# Patient Record
Sex: Female | Born: 1970 | Race: Black or African American | Hispanic: No | Marital: Single | Smoking: Never smoker
Health system: Southern US, Community
[De-identification: ages and names within clinical notes are randomized; demographics above are authoritative.]

## PROBLEM LIST (undated history)

## (undated) DIAGNOSIS — T7840XA Allergy, unspecified, initial encounter: Secondary | ICD-10-CM

## (undated) DIAGNOSIS — G245 Blepharospasm: Secondary | ICD-10-CM

## (undated) DIAGNOSIS — D649 Anemia, unspecified: Secondary | ICD-10-CM

## (undated) HISTORY — DX: Allergy, unspecified, initial encounter: T78.40XA

## (undated) HISTORY — DX: Anemia, unspecified: D64.9

## (undated) HISTORY — DX: Blepharospasm: G24.5

---

## 1995-08-31 HISTORY — PX: WISDOM TOOTH EXTRACTION: SHX21

## 1997-11-12 ENCOUNTER — Other Ambulatory Visit: Admission: RE | Admit: 1997-11-12 | Discharge: 1997-11-12 | Payer: Self-pay | Admitting: Obstetrics & Gynecology

## 1998-04-22 ENCOUNTER — Other Ambulatory Visit: Admission: RE | Admit: 1998-04-22 | Discharge: 1998-04-22 | Payer: Self-pay | Admitting: Obstetrics & Gynecology

## 1998-09-12 ENCOUNTER — Other Ambulatory Visit: Admission: RE | Admit: 1998-09-12 | Discharge: 1998-09-12 | Payer: Self-pay | Admitting: Obstetrics & Gynecology

## 1999-07-02 ENCOUNTER — Encounter: Admission: RE | Admit: 1999-07-02 | Discharge: 1999-07-02 | Payer: Self-pay | Admitting: Family Medicine

## 1999-07-16 ENCOUNTER — Encounter: Admission: RE | Admit: 1999-07-16 | Discharge: 1999-07-16 | Payer: Self-pay | Admitting: Family Medicine

## 1999-09-18 ENCOUNTER — Other Ambulatory Visit: Admission: RE | Admit: 1999-09-18 | Discharge: 1999-09-18 | Payer: Self-pay | Admitting: Obstetrics & Gynecology

## 2000-03-01 ENCOUNTER — Emergency Department (HOSPITAL_COMMUNITY): Admission: EM | Admit: 2000-03-01 | Discharge: 2000-03-01 | Payer: Self-pay | Admitting: Emergency Medicine

## 2000-03-01 ENCOUNTER — Encounter: Payer: Self-pay | Admitting: Emergency Medicine

## 2000-03-17 ENCOUNTER — Encounter: Admission: RE | Admit: 2000-03-17 | Discharge: 2000-03-17 | Payer: Self-pay | Admitting: Family Medicine

## 2000-09-20 ENCOUNTER — Other Ambulatory Visit: Admission: RE | Admit: 2000-09-20 | Discharge: 2000-09-20 | Payer: Self-pay | Admitting: Obstetrics & Gynecology

## 2001-06-06 ENCOUNTER — Encounter: Payer: Self-pay | Admitting: Obstetrics and Gynecology

## 2001-06-06 ENCOUNTER — Inpatient Hospital Stay (HOSPITAL_COMMUNITY): Admission: AD | Admit: 2001-06-06 | Discharge: 2001-06-08 | Payer: Self-pay | Admitting: Obstetrics & Gynecology

## 2001-11-03 ENCOUNTER — Other Ambulatory Visit: Admission: RE | Admit: 2001-11-03 | Discharge: 2001-11-03 | Payer: Self-pay | Admitting: Obstetrics & Gynecology

## 2002-06-12 ENCOUNTER — Encounter: Payer: Self-pay | Admitting: Obstetrics & Gynecology

## 2002-06-12 ENCOUNTER — Encounter: Admission: RE | Admit: 2002-06-12 | Discharge: 2002-06-12 | Payer: Self-pay | Admitting: Obstetrics & Gynecology

## 2002-08-27 ENCOUNTER — Encounter: Admission: RE | Admit: 2002-08-27 | Discharge: 2002-08-27 | Payer: Self-pay | Admitting: Family Medicine

## 2002-12-14 ENCOUNTER — Other Ambulatory Visit: Admission: RE | Admit: 2002-12-14 | Discharge: 2002-12-14 | Payer: Self-pay | Admitting: Obstetrics & Gynecology

## 2003-12-17 ENCOUNTER — Other Ambulatory Visit: Admission: RE | Admit: 2003-12-17 | Discharge: 2003-12-17 | Payer: Self-pay | Admitting: Obstetrics & Gynecology

## 2004-06-03 ENCOUNTER — Encounter: Admission: RE | Admit: 2004-06-03 | Discharge: 2004-06-03 | Payer: Self-pay | Admitting: Family Medicine

## 2004-12-18 ENCOUNTER — Other Ambulatory Visit: Admission: RE | Admit: 2004-12-18 | Discharge: 2004-12-18 | Payer: Self-pay | Admitting: Obstetrics & Gynecology

## 2008-08-20 ENCOUNTER — Encounter (INDEPENDENT_AMBULATORY_CARE_PROVIDER_SITE_OTHER): Payer: Self-pay | Admitting: *Deleted

## 2008-09-03 ENCOUNTER — Ambulatory Visit: Payer: Self-pay | Admitting: Family Medicine

## 2008-09-03 ENCOUNTER — Telehealth (INDEPENDENT_AMBULATORY_CARE_PROVIDER_SITE_OTHER): Payer: Self-pay | Admitting: *Deleted

## 2008-09-03 DIAGNOSIS — R55 Syncope and collapse: Secondary | ICD-10-CM

## 2008-09-04 ENCOUNTER — Telehealth (INDEPENDENT_AMBULATORY_CARE_PROVIDER_SITE_OTHER): Payer: Self-pay | Admitting: *Deleted

## 2008-09-05 ENCOUNTER — Telehealth (INDEPENDENT_AMBULATORY_CARE_PROVIDER_SITE_OTHER): Payer: Self-pay | Admitting: *Deleted

## 2008-09-05 LAB — CONVERTED CEMR LAB
ALT: 14 units/L (ref 0–35)
AST: 23 units/L (ref 0–37)
Ammonia: 89 umol/L — ABNORMAL HIGH (ref 11–35)
Basophils Relative: 0 % (ref 0.0–3.0)
Bilirubin, Direct: 0.1 mg/dL (ref 0.0–0.3)
CO2: 26 meq/L (ref 19–32)
Chloride: 107 meq/L (ref 96–112)
Cholesterol: 174 mg/dL (ref 0–200)
Ferritin: 2.4 ng/mL — ABNORMAL LOW (ref 10.0–291.0)
GFR calc non Af Amer: 99 mL/min
HCT: 23.6 % — CL (ref 36.0–46.0)
Hemoglobin: 7.1 g/dL — CL (ref 12.0–15.0)
MCHC: 30.6 g/dL (ref 30.0–36.0)
MCV: 59.3 fL — ABNORMAL LOW (ref 78.0–100.0)
Potassium: 3.9 meq/L (ref 3.5–5.1)
RBC: 4.02 M/uL (ref 3.87–5.11)
Sodium: 141 meq/L (ref 135–145)
TSH: 0.83 microintl units/mL (ref 0.35–5.50)
Total Bilirubin: 0.5 mg/dL (ref 0.3–1.2)
Transferrin: 363.9 mg/dL — ABNORMAL HIGH (ref >=212.0)
VLDL: 15 mg/dL (ref 0–40)

## 2008-09-06 ENCOUNTER — Encounter (INDEPENDENT_AMBULATORY_CARE_PROVIDER_SITE_OTHER): Payer: Self-pay | Admitting: *Deleted

## 2008-09-12 ENCOUNTER — Ambulatory Visit: Payer: Self-pay | Admitting: Family Medicine

## 2008-09-17 ENCOUNTER — Encounter (INDEPENDENT_AMBULATORY_CARE_PROVIDER_SITE_OTHER): Payer: Self-pay | Admitting: *Deleted

## 2008-10-02 ENCOUNTER — Ambulatory Visit: Payer: Self-pay | Admitting: Family Medicine

## 2008-10-03 ENCOUNTER — Encounter (INDEPENDENT_AMBULATORY_CARE_PROVIDER_SITE_OTHER): Payer: Self-pay | Admitting: *Deleted

## 2008-10-03 LAB — CONVERTED CEMR LAB
Basophils Absolute: 0 10*3/uL (ref 0.0–0.1)
Basophils Relative: 0.1 % (ref 0.0–3.0)
Eosinophils Absolute: 0.3 10*3/uL (ref 0.0–0.7)
Ferritin: 11.7 ng/mL (ref 10.0–291.0)
Lymphocytes Relative: 25.2 % (ref 12.0–46.0)
MCHC: 31.4 g/dL (ref 30.0–36.0)
Neutrophils Relative %: 63.4 % (ref 43.0–77.0)
Platelets: 346 10*3/uL (ref 150–400)
RBC: 4.48 M/uL (ref 3.87–5.11)
Saturation Ratios: 61.5 % — ABNORMAL HIGH (ref 20.0–50.0)
Transferrin: 298.4 mg/dL (ref >=212.0)
WBC: 6.1 10*3/uL (ref 4.5–10.5)

## 2008-10-16 ENCOUNTER — Encounter: Admission: RE | Admit: 2008-10-16 | Discharge: 2008-10-16 | Payer: Self-pay | Admitting: Obstetrics & Gynecology

## 2008-10-23 ENCOUNTER — Encounter: Admission: RE | Admit: 2008-10-23 | Discharge: 2008-10-23 | Payer: Self-pay | Admitting: Obstetrics & Gynecology

## 2010-06-04 ENCOUNTER — Encounter: Admission: RE | Admit: 2010-06-04 | Discharge: 2010-06-04 | Payer: Self-pay | Admitting: Obstetrics & Gynecology

## 2010-06-12 ENCOUNTER — Encounter: Admission: RE | Admit: 2010-06-12 | Discharge: 2010-06-12 | Payer: Self-pay | Admitting: Obstetrics & Gynecology

## 2010-09-19 ENCOUNTER — Encounter: Payer: Self-pay | Admitting: Obstetrics & Gynecology

## 2011-04-28 ENCOUNTER — Encounter: Payer: Self-pay | Admitting: Family Medicine

## 2011-05-31 ENCOUNTER — Telehealth: Payer: Self-pay

## 2011-05-31 ENCOUNTER — Encounter: Payer: Self-pay | Admitting: Family Medicine

## 2011-05-31 ENCOUNTER — Ambulatory Visit (INDEPENDENT_AMBULATORY_CARE_PROVIDER_SITE_OTHER): Payer: BC Managed Care – PPO | Admitting: Family Medicine

## 2011-05-31 DIAGNOSIS — D649 Anemia, unspecified: Secondary | ICD-10-CM

## 2011-05-31 DIAGNOSIS — Z0000 Encounter for general adult medical examination without abnormal findings: Secondary | ICD-10-CM

## 2011-05-31 DIAGNOSIS — Z13220 Encounter for screening for lipoid disorders: Secondary | ICD-10-CM

## 2011-05-31 DIAGNOSIS — R42 Dizziness and giddiness: Secondary | ICD-10-CM

## 2011-05-31 LAB — LIPID PANEL
HDL: 60.3 mg/dL (ref >39.00)
LDL Cholesterol: 72 mg/dL (ref 0–99)
Total CHOL/HDL Ratio: 2
VLDL: 11 mg/dL (ref 0.0–40.0)

## 2011-05-31 LAB — HEPATIC FUNCTION PANEL
ALT: 9 U/L (ref 0–35)
AST: 19 U/L (ref 0–37)
Alkaline Phosphatase: 57 U/L (ref 39–117)
Bilirubin, Direct: 0 mg/dL (ref 0.0–0.3)
Total Bilirubin: 0.4 mg/dL (ref 0.3–1.2)

## 2011-05-31 LAB — BASIC METABOLIC PANEL
BUN: 10 mg/dL (ref 6–23)
Chloride: 110 mEq/L (ref 96–112)
GFR: 114.13 mL/min (ref >60.00)
Potassium: 4 mEq/L (ref 3.5–5.1)
Sodium: 143 mEq/L (ref 135–145)

## 2011-05-31 LAB — TSH: TSH: 1.3 u[IU]/mL (ref 0.35–5.50)

## 2011-05-31 LAB — CBC WITH DIFFERENTIAL/PLATELET
Hemoglobin: 6.1 g/dL — CL (ref 12.0–15.0)
MCHC: 27.8 g/dL — ABNORMAL LOW (ref 30.0–36.0)
Platelets: 453 10*3/uL — ABNORMAL HIGH (ref 150.0–400.0)
RDW: 22.9 % — ABNORMAL HIGH (ref 11.5–14.6)

## 2011-05-31 MED ORDER — FERROUS SULFATE 325 (65 FE) MG PO TABS: 325.0000 mg | ORAL_TABLET | Freq: Three times a day (TID) | ORAL | Status: DC

## 2011-05-31 NOTE — Assessment & Plan Note (Signed)
Most likely due to recurrent anemia.  Check labs to r/o thyroid or electrolyte abnormality.  Reviewed supportive care and red flags that should prompt return.  Pt expressed understanding and is in agreement w/ plan.

## 2011-05-31 NOTE — Progress Notes (Signed)
  Subjective:    Patient ID: Kerry Lawson, female    DOB: [birth year], 40 y.o.   MRN: [id number]  HPI CPE- Frankey Poot.  UTD on mammo  Dizziness- will occur after 10 minutes of exercise or by 2nd flight of stairs.  Some associated SOB.  Hx of anemia in the past, admits to poor fluid intake.  Attempting to eat more regularly.   Review of Systems ROS Patient reports no vision/ hearing changes, adenopathy,fever, weight change,  persistant/recurrent hoarseness , swallowing issues, chest pain, palpitations, edema, persistant/recurrent cough, hemoptysis, gastrointestinal bleeding (melena, rectal bleeding), abdominal pain, significant heartburn, bowel changes, GU symptoms (dysuria, hematuria, incontinence), Gyn symptoms (abnormal  bleeding, pain),  syncope, focal weakness, memory loss, numbness & tingling, skin/hair/nail changes, abnormal bruising or bleeding, anxiety, or depression.     Objective:   Physical Exam  General Appearance:    Alert, cooperative, no distress, appears stated age  Head:    Normocephalic, without obvious abnormality, atraumatic  Eyes:    PERRL, conjunctiva/corneas clear, EOM's intact, fundi    benign, both eyes.  + conjunctival pallor  Ears:    Normal TM's and external ear canals, both ears  Nose:   Nares normal, septum midline, mucosa normal, no drainage    or sinus tenderness  Throat:   Lips, mucosa, and tongue normal; teeth and gums normal, pallor of oral mucosa/tongue  Neck:   Supple, symmetrical, trachea midline, no adenopathy;    Thyroid: no enlargement/tenderness/nodules  Back:     Symmetric, no curvature, ROM normal, no CVA tenderness  Lungs:     Clear to auscultation bilaterally, respirations unlabored  Chest Wall:    No tenderness or deformity   Heart:    Regular rate and rhythm, S1 and S2 normal, no murmur, rub   or gallop  Breast Exam:    Deferred to GYN  Abdomen:     Soft, non-tender, bowel sounds active all four quadrants,    no masses, no  organomegaly  Genitalia:    Deferred to GYN  Rectal:    Extremities:   Extremities normal, atraumatic, no cyanosis or edema  Pulses:   2+ and symmetric all extremities  Skin:   Skin color, texture, turgor normal, no rashes or lesions  Lymph nodes:   Cervical, supraclavicular, and axillary nodes normal  Neurologic:   CNII-XII intact, normal strength, sensation and reflexes    throughout          Assessment & Plan:

## 2011-05-31 NOTE — Assessment & Plan Note (Signed)
Pt has hx of iron deficiency anemia requiring TID iron.  Pt had stopped iron ~9 months ago.  Started having sxs 6 months ago.  Will check labs and determine appropriate dosing of iron based on these.  Pt expressed understanding and is in agreement w/ plan.

## 2011-05-31 NOTE — Patient Instructions (Signed)
We'll notify you of your lab results and restart iron as needed My guess is that the dizziness and shortness of breath are all due to anemia You look great!  Keep up the good work! Call with any questions or concerns CONGRATS on the upcoming graduation!!!

## 2011-05-31 NOTE — Telephone Encounter (Signed)
Pt aware and Rx sent to pharmacy  

## 2011-05-31 NOTE — Assessment & Plan Note (Signed)
Pt's PE WNL w/ exception of pallor.  UTD on gyn screening.  Check labs.  Anticipatory guidance provided.

## 2011-05-31 NOTE — Telephone Encounter (Signed)
Message copied by Beverely Low on Mon May 31, 2011  4:58 PM ------      Message from: Sheliah Hatch      Created: Mon May 31, 2011  4:41 PM       Pt's Hgb is critically low again.  Needs to resume FeSO4 325mg  TID to improve blood counts.            Remainder of labs look good!

## 2011-05-31 NOTE — Telephone Encounter (Signed)
Message copied by Beverely Low on Mon May 31, 2011  4:49 PM ------      Message from: Sheliah Hatch      Created: Mon May 31, 2011  4:41 PM       Pt's Hgb is critically low again.  Needs to resume FeSO4 325mg  TID to improve blood counts.            Remainder of labs look good!

## 2011-05-31 NOTE — Telephone Encounter (Signed)
Left message for pt to call back  °

## 2011-06-02 LAB — VITAMIN D 1,25 DIHYDROXY
Vitamin D 1, 25 (OH)2 Total: 73 pg/mL — ABNORMAL HIGH (ref 18–72)
Vitamin D3 1, 25 (OH)2: 73 pg/mL

## 2011-06-04 NOTE — Progress Notes (Signed)
Quick Note:  Labs mailed and pt aware ______

## 2011-06-09 ENCOUNTER — Other Ambulatory Visit: Payer: Self-pay | Admitting: Obstetrics & Gynecology

## 2011-06-09 DIAGNOSIS — Z1231 Encounter for screening mammogram for malignant neoplasm of breast: Secondary | ICD-10-CM

## 2011-06-15 ENCOUNTER — Ambulatory Visit
Admission: RE | Admit: 2011-06-15 | Discharge: 2011-06-15 | Disposition: A | Discharge status: Home / Self Care | Payer: BC Managed Care – PPO | Source: Ambulatory Visit | Attending: Obstetrics & Gynecology | Admitting: Obstetrics & Gynecology

## 2011-06-15 DIAGNOSIS — Z1231 Encounter for screening mammogram for malignant neoplasm of breast: Secondary | ICD-10-CM

## 2012-11-08 ENCOUNTER — Other Ambulatory Visit: Payer: Self-pay

## 2012-11-08 DIAGNOSIS — Z1231 Encounter for screening mammogram for malignant neoplasm of breast: Secondary | ICD-10-CM

## 2012-11-15 ENCOUNTER — Other Ambulatory Visit: Payer: Self-pay | Admitting: Family Medicine

## 2012-11-15 ENCOUNTER — Other Ambulatory Visit: Payer: Self-pay | Admitting: Obstetrics & Gynecology

## 2012-11-15 ENCOUNTER — Ambulatory Visit
Admission: RE | Admit: 2012-11-15 | Discharge: 2012-11-15 | Disposition: A | Discharge status: Home / Self Care | Payer: BC Managed Care – PPO | Source: Ambulatory Visit

## 2012-11-15 DIAGNOSIS — Z1231 Encounter for screening mammogram for malignant neoplasm of breast: Secondary | ICD-10-CM

## 2012-12-28 LAB — HM MAMMOGRAPHY: HM MAMMO: NORMAL

## 2013-10-04 LAB — HM PAP SMEAR: HM Pap smear: NORMAL

## 2013-12-31 ENCOUNTER — Telehealth: Payer: Self-pay

## 2013-12-31 NOTE — Telephone Encounter (Signed)
Left message for call back Non-identifiable  MMG- 11/15/12-negative

## 2014-01-01 ENCOUNTER — Ambulatory Visit (INDEPENDENT_AMBULATORY_CARE_PROVIDER_SITE_OTHER): Payer: BC Managed Care – PPO | Admitting: Family Medicine

## 2014-01-01 ENCOUNTER — Encounter: Payer: Self-pay | Admitting: Family Medicine

## 2014-01-01 VITALS — BP 102/80 | HR 73 | Temp 98.2°F | Resp 16 | Ht 60.5 in | Wt 160.4 lb

## 2014-01-01 DIAGNOSIS — Z0000 Encounter for general adult medical examination without abnormal findings: Secondary | ICD-10-CM

## 2014-01-01 DIAGNOSIS — D649 Anemia, unspecified: Secondary | ICD-10-CM

## 2014-01-01 DIAGNOSIS — Z23 Encounter for immunization: Secondary | ICD-10-CM

## 2014-01-01 LAB — CBC WITH DIFFERENTIAL/PLATELET
BASOS ABS: 0 10*3/uL (ref 0.0–0.1)
Basophils Relative: 0.5 % (ref 0.0–3.0)
EOS ABS: 0.2 10*3/uL (ref 0.0–0.7)
EOS PCT: 2.7 % (ref 0.0–5.0)
HCT: 33.1 % — ABNORMAL LOW (ref 36.0–46.0)
Hemoglobin: 10.3 g/dL — ABNORMAL LOW (ref 12.0–15.0)
Lymphocytes Relative: 17.3 % (ref 12.0–46.0)
Lymphs Abs: 1.2 10*3/uL (ref 0.7–4.0)
MCHC: 31.1 g/dL (ref 30.0–36.0)
MCV: 72 fl — AB (ref 78.0–100.0)
MONO ABS: 0.3 10*3/uL (ref 0.1–1.0)
Monocytes Relative: 3.8 % (ref 3.0–12.0)
NEUTROS PCT: 75.7 % (ref 43.0–77.0)
Neutro Abs: 5.4 10*3/uL (ref 1.4–7.7)
PLATELETS: 355 10*3/uL (ref 150.0–400.0)
RBC: 4.59 Mil/uL (ref 3.87–5.11)
RDW: 19.2 % — AB (ref 11.5–15.5)
WBC: 7.2 10*3/uL (ref 4.0–10.5)

## 2014-01-01 LAB — BASIC METABOLIC PANEL
BUN: 8 mg/dL (ref 6–23)
CHLORIDE: 105 meq/L (ref 96–112)
CO2: 25 meq/L (ref 19–32)
CREATININE: 0.9 mg/dL (ref 0.4–1.2)
Calcium: 8.5 mg/dL (ref 8.4–10.5)
GFR: 89.45 mL/min (ref >60.00)
Glucose, Bld: 82 mg/dL (ref 70–99)
Potassium: 3.5 mEq/L (ref 3.5–5.1)
Sodium: 136 mEq/L (ref 135–145)

## 2014-01-01 LAB — HEPATIC FUNCTION PANEL
ALK PHOS: 46 U/L (ref 39–117)
ALT: 10 U/L (ref 0–35)
AST: 19 U/L (ref 0–37)
Albumin: 3.1 g/dL — ABNORMAL LOW (ref 3.5–5.2)
BILIRUBIN TOTAL: 0.1 mg/dL — AB (ref 0.2–1.2)
Bilirubin, Direct: 0 mg/dL (ref 0.0–0.3)
Total Protein: 6.8 g/dL (ref 6.0–8.3)

## 2014-01-01 LAB — LIPID PANEL
CHOL/HDL RATIO: 3
CHOLESTEROL: 183 mg/dL (ref 0–200)
HDL: 71.2 mg/dL (ref >39.00)
LDL Cholesterol: 91 mg/dL (ref 0–99)
TRIGLYCERIDES: 103 mg/dL (ref 0.0–149.0)
VLDL: 20.6 mg/dL (ref 0.0–40.0)

## 2014-01-01 LAB — IBC PANEL
Iron: 19 ug/dL — ABNORMAL LOW (ref 42–145)
Saturation Ratios: 3.6 % — ABNORMAL LOW (ref 20.0–50.0)
Transferrin: 376.7 mg/dL — ABNORMAL HIGH (ref 212.0–360.0)

## 2014-01-01 LAB — TSH: TSH: 0.59 u[IU]/mL (ref 0.35–4.50)

## 2014-01-01 NOTE — Patient Instructions (Signed)
Follow up in 1 year or as needed We'll notify you of your lab results and make any changes if needed Call with any questions or concerns Keep up the good work!!  You look great!  

## 2014-01-01 NOTE — Assessment & Plan Note (Signed)
Chronic problem.  Check labs to determine whether pt needs to continue iron

## 2014-01-01 NOTE — Progress Notes (Signed)
Pre visit review using our clinic review tool, if applicable. No additional management support is needed unless otherwise documented below in the visit note. 

## 2014-01-01 NOTE — Addendum Note (Signed)
Addended by: Kris Hartmann on: 01/01/2014 08:48 AM   Modules accepted: Orders

## 2014-01-01 NOTE — Assessment & Plan Note (Signed)
Pt's PE WNL.  UTD on GYN.  Check labs.  Anticipatory guidance provided.  

## 2014-01-01 NOTE — Progress Notes (Signed)
   Subjective:    Patient ID: Kerry Lawson, female    DOB: [birth year], 43 y.o.   MRN: [id number]  HPI CPE- UTD on GYN Deatra Ina)  No concerns today.   Review of Systems Patient reports no vision/ hearing changes, adenopathy,fever, weight change,  persistant/recurrent hoarseness , swallowing issues, chest pain, palpitations, edema, persistant/recurrent cough, hemoptysis, dyspnea (rest/exertional/paroxysmal nocturnal), gastrointestinal bleeding (melena, rectal bleeding), abdominal pain, significant heartburn, bowel changes, GU symptoms (dysuria, hematuria, incontinence), Gyn symptoms (abnormal  bleeding, pain),  syncope, focal weakness, memory loss, numbness & tingling, skin/hair/nail changes, abnormal bruising or bleeding, anxiety, or depression.     Objective:   Physical Exam General Appearance:    Alert, cooperative, no distress, appears stated age  Head:    Normocephalic, without obvious abnormality, atraumatic  Eyes:    PERRL, conjunctiva/corneas clear, EOM's intact, fundi    benign, both eyes  Ears:    Normal TM's and external ear canals, both ears  Nose:   Nares normal, septum midline, mucosa normal, no drainage    or sinus tenderness  Throat:   Lips, mucosa, and tongue normal; teeth and gums normal  Neck:   Supple, symmetrical, trachea midline, no adenopathy;    Thyroid: no enlargement/tenderness/nodules  Back:     Symmetric, no curvature, ROM normal, no CVA tenderness  Lungs:     Clear to auscultation bilaterally, respirations unlabored  Chest Wall:    No tenderness or deformity   Heart:    Regular rate and rhythm, S1 and S2 normal, no murmur, rub   or gallop  Breast Exam:    Deferred to GYN  Abdomen:     Soft, non-tender, bowel sounds active all four quadrants,    no masses, no organomegaly  Genitalia:    Deferred to GYN  Rectal:    Extremities:   Extremities normal, atraumatic, no cyanosis or edema  Pulses:   2+ and symmetric all extremities  Skin:   Skin color, texture,  turgor normal, no rashes or lesions  Lymph nodes:   Cervical, supraclavicular, and axillary nodes normal  Neurologic:   CNII-XII intact, normal strength, sensation and reflexes    throughout          Assessment & Plan:

## 2014-01-02 ENCOUNTER — Other Ambulatory Visit: Payer: Self-pay | Admitting: Family Medicine

## 2014-01-02 ENCOUNTER — Encounter: Payer: Self-pay | Admitting: General Practice

## 2014-01-02 DIAGNOSIS — D509 Iron deficiency anemia, unspecified: Secondary | ICD-10-CM

## 2014-01-06 LAB — VITAMIN D 1,25 DIHYDROXY
VITAMIN D 1, 25 (OH) TOTAL: 102 pg/mL — AB (ref 18–72)
VITAMIN D3 1, 25 (OH): 102 pg/mL
Vitamin D2 1, 25 (OH)2: <8 pg/mL

## 2014-01-07 NOTE — Telephone Encounter (Signed)
Unable to reach pre visit.  

## 2014-01-08 ENCOUNTER — Encounter: Payer: Self-pay | Admitting: General Practice

## 2014-03-19 ENCOUNTER — Other Ambulatory Visit (INDEPENDENT_AMBULATORY_CARE_PROVIDER_SITE_OTHER): Payer: BC Managed Care – PPO

## 2014-03-19 DIAGNOSIS — E673 Hypervitaminosis D: Secondary | ICD-10-CM

## 2014-03-19 DIAGNOSIS — D509 Iron deficiency anemia, unspecified: Secondary | ICD-10-CM

## 2014-03-19 LAB — CBC WITH DIFFERENTIAL/PLATELET
BASOS ABS: 0 10*3/uL (ref 0.0–0.1)
BASOS PCT: 0.5 % (ref 0.0–3.0)
EOS ABS: 0.1 10*3/uL (ref 0.0–0.7)
Eosinophils Relative: 2.4 % (ref 0.0–5.0)
HEMATOCRIT: 38.7 % (ref 36.0–46.0)
HEMOGLOBIN: 12.8 g/dL (ref 12.0–15.0)
LYMPHS ABS: 1.3 10*3/uL (ref 0.7–4.0)
Lymphocytes Relative: 28.5 % (ref 12.0–46.0)
MCHC: 33.2 g/dL (ref 30.0–36.0)
MCV: 83.3 fl (ref 78.0–100.0)
Monocytes Absolute: 0.3 10*3/uL (ref 0.1–1.0)
Monocytes Relative: 5.5 % (ref 3.0–12.0)
NEUTROS ABS: 2.9 10*3/uL (ref 1.4–7.7)
Neutrophils Relative %: 63.1 % (ref 43.0–77.0)
Platelets: 222 10*3/uL (ref 150.0–400.0)
RBC: 4.64 Mil/uL (ref 3.87–5.11)
RDW: 22.7 % — AB (ref 11.5–15.5)
WBC: 4.6 10*3/uL (ref 4.0–10.5)

## 2014-03-19 LAB — IBC PANEL
Iron: 49 ug/dL (ref 42–145)
Saturation Ratios: 12.2 % — ABNORMAL LOW (ref 20.0–50.0)
Transferrin: 287.8 mg/dL (ref 212.0–360.0)

## 2014-03-19 LAB — VITAMIN D 25 HYDROXY (VIT D DEFICIENCY, FRACTURES): VITD: 18.23 ng/mL

## 2014-03-20 ENCOUNTER — Other Ambulatory Visit: Payer: Self-pay | Admitting: General Practice

## 2014-03-20 MED ORDER — VITAMIN D (ERGOCALCIFEROL) 1.25 MG (50000 UNIT) PO CAPS: 50000.0000 [IU] | ORAL_CAPSULE | ORAL | Status: DC

## 2014-04-04 ENCOUNTER — Encounter: Payer: Self-pay | Admitting: Family Medicine

## 2014-05-27 ENCOUNTER — Other Ambulatory Visit: Payer: Self-pay

## 2014-05-27 DIAGNOSIS — Z1231 Encounter for screening mammogram for malignant neoplasm of breast: Secondary | ICD-10-CM

## 2014-06-03 ENCOUNTER — Ambulatory Visit
Admission: RE | Admit: 2014-06-03 | Discharge: 2014-06-03 | Disposition: A | Discharge status: Home / Self Care | Payer: BC Managed Care – PPO | Source: Ambulatory Visit

## 2014-06-03 DIAGNOSIS — Z1231 Encounter for screening mammogram for malignant neoplasm of breast: Secondary | ICD-10-CM

## 2014-06-05 ENCOUNTER — Other Ambulatory Visit: Payer: Self-pay | Admitting: Obstetrics & Gynecology

## 2014-06-05 DIAGNOSIS — R928 Other abnormal and inconclusive findings on diagnostic imaging of breast: Secondary | ICD-10-CM

## 2014-06-11 ENCOUNTER — Ambulatory Visit
Admission: RE | Admit: 2014-06-11 | Discharge: 2014-06-11 | Disposition: A | Discharge status: Home / Self Care | Payer: BC Managed Care – PPO | Source: Ambulatory Visit | Attending: Obstetrics & Gynecology | Admitting: Obstetrics & Gynecology

## 2014-06-11 DIAGNOSIS — R928 Other abnormal and inconclusive findings on diagnostic imaging of breast: Secondary | ICD-10-CM

## 2014-12-23 ENCOUNTER — Telehealth: Payer: Self-pay | Admitting: Family Medicine

## 2014-12-23 NOTE — Telephone Encounter (Signed)
previst letter for annual exam mailed

## 2015-01-10 ENCOUNTER — Telehealth: Payer: Self-pay | Admitting: *Deleted

## 2015-01-10 NOTE — Telephone Encounter (Signed)
Unable to reach patient at time of Pre-Visit Call.  Left message for patient to return call when available.    

## 2015-01-13 ENCOUNTER — Other Ambulatory Visit: Payer: Self-pay | Admitting: General Practice

## 2015-01-13 ENCOUNTER — Encounter: Payer: Self-pay | Admitting: Family Medicine

## 2015-01-13 ENCOUNTER — Ambulatory Visit (INDEPENDENT_AMBULATORY_CARE_PROVIDER_SITE_OTHER): Payer: BC Managed Care – PPO | Admitting: Family Medicine

## 2015-01-13 VITALS — BP 122/78 | HR 72 | Temp 97.9°F | Resp 16 | Ht 60.0 in | Wt 167.1 lb

## 2015-01-13 DIAGNOSIS — Z91018 Allergy to other foods: Secondary | ICD-10-CM | POA: Diagnosis not present

## 2015-01-13 DIAGNOSIS — G245 Blepharospasm: Secondary | ICD-10-CM | POA: Diagnosis not present

## 2015-01-13 DIAGNOSIS — Z0000 Encounter for general adult medical examination without abnormal findings: Secondary | ICD-10-CM

## 2015-01-13 LAB — CBC WITH DIFFERENTIAL/PLATELET
BASOS ABS: 0.1 10*3/uL (ref 0.0–0.1)
Basophils Relative: 0.9 % (ref 0.0–3.0)
Eosinophils Absolute: 0.2 10*3/uL (ref 0.0–0.7)
Eosinophils Relative: 3 % (ref 0.0–5.0)
HCT: 38.7 % (ref 36.0–46.0)
Hemoglobin: 12.9 g/dL (ref 12.0–15.0)
LYMPHS ABS: 1.3 10*3/uL (ref 0.7–4.0)
Lymphocytes Relative: 21.8 % (ref 12.0–46.0)
MCHC: 33.4 g/dL (ref 30.0–36.0)
MCV: 86.2 fl (ref 78.0–100.0)
MONO ABS: 0.3 10*3/uL (ref 0.1–1.0)
MONOS PCT: 5.6 % (ref 3.0–12.0)
Neutro Abs: 4.2 10*3/uL (ref 1.4–7.7)
Neutrophils Relative %: 68.7 % (ref 43.0–77.0)
PLATELETS: 271 10*3/uL (ref 150.0–400.0)
RBC: 4.49 Mil/uL (ref 3.87–5.11)
RDW: 13.4 % (ref 11.5–15.5)
WBC: 6.1 10*3/uL (ref 4.0–10.5)

## 2015-01-13 LAB — HEPATIC FUNCTION PANEL
ALT: 16 U/L (ref 0–35)
AST: 20 U/L (ref 0–37)
Albumin: 3.4 g/dL — ABNORMAL LOW (ref 3.5–5.2)
Alkaline Phosphatase: 50 U/L (ref 39–117)
BILIRUBIN DIRECT: 0.1 mg/dL (ref 0.0–0.3)
BILIRUBIN TOTAL: 0.3 mg/dL (ref 0.2–1.2)
Total Protein: 6.7 g/dL (ref 6.0–8.3)

## 2015-01-13 LAB — TSH: TSH: 0.82 u[IU]/mL (ref 0.35–4.50)

## 2015-01-13 LAB — B12 AND FOLATE PANEL
Folate: 14.8 ng/mL (ref >5.9)
VITAMIN B 12: 413 pg/mL (ref 211–911)

## 2015-01-13 LAB — BASIC METABOLIC PANEL
BUN: 12 mg/dL (ref 6–23)
CHLORIDE: 105 meq/L (ref 96–112)
CO2: 27 mEq/L (ref 19–32)
CREATININE: 0.91 mg/dL (ref 0.40–1.20)
Calcium: 8.9 mg/dL (ref 8.4–10.5)
GFR: 85.66 mL/min (ref >60.00)
GLUCOSE: 95 mg/dL (ref 70–99)
Potassium: 4.1 mEq/L (ref 3.5–5.1)
Sodium: 136 mEq/L (ref 135–145)

## 2015-01-13 LAB — VITAMIN D 25 HYDROXY (VIT D DEFICIENCY, FRACTURES): VITD: 18.7 ng/mL — AB (ref 30.00–100.00)

## 2015-01-13 LAB — LIPID PANEL
CHOL/HDL RATIO: 2
Cholesterol: 175 mg/dL (ref 0–200)
HDL: 71 mg/dL (ref >39.00)
LDL CALC: 90 mg/dL (ref 0–99)
NonHDL: 104
Triglycerides: 71 mg/dL (ref 0.0–149.0)
VLDL: 14.2 mg/dL (ref 0.0–40.0)

## 2015-01-13 MED ORDER — EPINEPHRINE 0.3 MG/0.3ML IJ SOAJ: 0.3000 mg | Freq: Once | INTRAMUSCULAR | Status: AC

## 2015-01-13 MED ORDER — VITAMIN D (ERGOCALCIFEROL) 1.25 MG (50000 UNIT) PO CAPS: 50000.0000 [IU] | ORAL_CAPSULE | ORAL | Status: DC

## 2015-01-13 NOTE — Progress Notes (Signed)
Pre visit review using our clinic review tool, if applicable. No additional management support is needed unless otherwise documented below in the visit note. 

## 2015-01-13 NOTE — Assessment & Plan Note (Signed)
New to provider, dx'd by eye doctor.  Pt was advised to have vitamin levels checked- will replete prn.

## 2015-01-13 NOTE — Progress Notes (Signed)
   Subjective:    Patient ID: Kerry Lawson, female    DOB: 02-08-1971, 44 y.o.   MRN: [id number]  HPI CPE- UTD on GYN Deatra Ina).  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  Has never had testing done.  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   Review of Systems Patient reports no vision/ hearing changes, adenopathy,fever, weight change,  persistant/recurrent hoarseness , swallowing issues, chest pain, palpitations, edema, persistant/recurrent cough, hemoptysis, dyspnea (rest/exertional/paroxysmal nocturnal), gastrointestinal bleeding (melena, rectal bleeding), abdominal pain, significant heartburn, bowel changes, GU symptoms (dysuria, hematuria, incontinence), Gyn symptoms (abnormal  bleeding, pain),  syncope, focal weakness, memory loss, numbness & tingling, skin/hair/nail changes, abnormal bruising or bleeding, anxiety, or depression.     Objective:   Physical Exam General Appearance:    Alert, cooperative, no distress, appears stated age  Head:    Normocephalic, without obvious abnormality, atraumatic  Eyes:    PERRL, conjunctiva/corneas clear, EOM's intact, fundi    benign, both eyes  Ears:    Normal TM's and external ear canals, both ears  Nose:   Nares normal, septum midline, mucosa normal, no drainage    or sinus tenderness  Throat:   Lips, mucosa, and tongue normal; teeth and gums normal  Neck:   Supple, symmetrical, trachea midline, no adenopathy;    Thyroid: no enlargement/tenderness/nodules  Back:     Symmetric, no curvature, ROM normal, no CVA tenderness  Lungs:     Clear to auscultation bilaterally, respirations unlabored  Chest Wall:    No tenderness or deformity   Heart:    Regular rate and rhythm, S1 and S2 normal, no murmur, rub   or gallop  Breast Exam:    Deferred to GYN  Abdomen:     Soft, non-tender, bowel sounds active all four quadrants,    no masses, no organomegaly  Genitalia:    Deferred to GYN  Rectal:    Extremities:   Extremities  normal, atraumatic, no cyanosis or edema  Pulses:   2+ and symmetric all extremities  Skin:   Skin color, texture, turgor normal, no rashes or lesions  Lymph nodes:   Cervical, supraclavicular, and axillary nodes normal  Neurologic:   CNII-XII intact, normal strength, sensation and reflexes    throughout          Assessment & Plan:

## 2015-01-13 NOTE — Assessment & Plan Note (Signed)
Pt's PE WNL.  UTD on GYN.  Check labs.  Anticipatory guidance provided.  

## 2015-01-13 NOTE — Assessment & Plan Note (Signed)
New to provider, per pt report she had an issue the other day w/ a cashew exposure.  This resolved w/ Benadryl.  She has never had formal testing done nor does she have an epi-pen.  Labs ordered and script for epi-pen given.  Reviewed supportive care and red flags that should prompt return.  Pt expressed understanding and is in agreement w/ plan.

## 2015-01-13 NOTE — Patient Instructions (Signed)
Follow up in 1 year or as needed We'll notify you of your lab results and make any changes if needed Try and make healthy food choices and get regular exercise If exposure to nuts, take benadryl first and if you continue to have trouble swallowing or breathing, inject the EpiPen and call 911 Call with any questions or concerns Have a great summer!!!

## 2015-01-16 LAB — IGG FOOD PANEL
ALLERGEN, MILK, IGG: 7.23 ug/mL — AB (ref <0.15)
Beef, IgG: 5.3 ug/mL — ABNORMAL HIGH (ref <2.0)
Corn, IgG: <0.15 ug/mL (ref <0.15)
Egg white, IgG: <2 ug/mL (ref <2.0)
Egg yolk, IgG: <2 ug/mL (ref <2.0)
Peanut, IgG: <0.15 ug/mL (ref <0.15)
Wheat, IgG: <0.15 ug/mL (ref <0.15)

## 2015-01-20 ENCOUNTER — Other Ambulatory Visit: Payer: Self-pay | Admitting: Family Medicine

## 2015-01-20 DIAGNOSIS — Z91018 Allergy to other foods: Secondary | ICD-10-CM

## 2015-05-09 ENCOUNTER — Other Ambulatory Visit: Payer: Self-pay

## 2015-05-09 DIAGNOSIS — Z1231 Encounter for screening mammogram for malignant neoplasm of breast: Secondary | ICD-10-CM

## 2015-06-05 ENCOUNTER — Ambulatory Visit
Admission: RE | Admit: 2015-06-05 | Discharge: 2015-06-05 | Disposition: A | Discharge status: Home / Self Care | Payer: BC Managed Care – PPO | Source: Ambulatory Visit

## 2015-06-05 DIAGNOSIS — Z1231 Encounter for screening mammogram for malignant neoplasm of breast: Secondary | ICD-10-CM

## 2016-01-19 ENCOUNTER — Other Ambulatory Visit: Payer: Self-pay | Admitting: General Practice

## 2016-01-19 ENCOUNTER — Ambulatory Visit (INDEPENDENT_AMBULATORY_CARE_PROVIDER_SITE_OTHER): Payer: BC Managed Care – PPO | Admitting: Family Medicine

## 2016-01-19 ENCOUNTER — Encounter: Payer: Self-pay | Admitting: Family Medicine

## 2016-01-19 ENCOUNTER — Other Ambulatory Visit: Payer: BC Managed Care – PPO

## 2016-01-19 VITALS — BP 122/81 | HR 72 | Temp 98.1°F | Resp 16 | Ht 60.0 in | Wt 177.1 lb

## 2016-01-19 DIAGNOSIS — Z0000 Encounter for general adult medical examination without abnormal findings: Secondary | ICD-10-CM

## 2016-01-19 DIAGNOSIS — D649 Anemia, unspecified: Secondary | ICD-10-CM

## 2016-01-19 LAB — BASIC METABOLIC PANEL
BUN: 10 mg/dL (ref 6–23)
CALCIUM: 8.4 mg/dL (ref 8.4–10.5)
CO2: 25 mEq/L (ref 19–32)
Chloride: 104 mEq/L (ref 96–112)
Creatinine, Ser: 0.77 mg/dL (ref 0.40–1.20)
GFR: 103.41 mL/min (ref >60.00)
Glucose, Bld: 83 mg/dL (ref 70–99)
POTASSIUM: 3.9 meq/L (ref 3.5–5.1)
Sodium: 136 mEq/L (ref 135–145)

## 2016-01-19 LAB — LIPID PANEL
Cholesterol: 152 mg/dL (ref 0–200)
HDL: 60.2 mg/dL (ref >39.00)
LDL CALC: 77 mg/dL (ref 0–99)
NONHDL: 91.73
Total CHOL/HDL Ratio: 3
Triglycerides: 74 mg/dL (ref 0.0–149.0)
VLDL: 14.8 mg/dL (ref 0.0–40.0)

## 2016-01-19 LAB — CBC WITH DIFFERENTIAL/PLATELET
Basophils Absolute: 0.1 10*3/uL (ref 0.0–0.1)
Basophils Relative: 1.3 % (ref 0.0–3.0)
EOS PCT: 4.6 % (ref 0.0–5.0)
Eosinophils Absolute: 0.3 10*3/uL (ref 0.0–0.7)
HEMATOCRIT: 31.9 % — AB (ref 36.0–46.0)
Hemoglobin: 10.4 g/dL — ABNORMAL LOW (ref 12.0–15.0)
LYMPHS PCT: 24.4 % (ref 12.0–46.0)
Lymphs Abs: 1.6 10*3/uL (ref 0.7–4.0)
MCHC: 32.5 g/dL (ref 30.0–36.0)
MCV: 78.6 fl (ref 78.0–100.0)
MONOS PCT: 7.2 % (ref 3.0–12.0)
Monocytes Absolute: 0.5 10*3/uL (ref 0.1–1.0)
Neutro Abs: 4.2 10*3/uL (ref 1.4–7.7)
Neutrophils Relative %: 62.5 % (ref 43.0–77.0)
PLATELETS: 283 10*3/uL (ref 150.0–400.0)
RBC: 4.07 Mil/uL (ref 3.87–5.11)
RDW: 16 % — ABNORMAL HIGH (ref 11.5–15.5)
WBC: 6.7 10*3/uL (ref 4.0–10.5)

## 2016-01-19 LAB — HEPATIC FUNCTION PANEL
ALK PHOS: 48 U/L (ref 39–117)
ALT: 11 U/L (ref 0–35)
AST: 17 U/L (ref 0–37)
Albumin: 3.3 g/dL — ABNORMAL LOW (ref 3.5–5.2)
BILIRUBIN DIRECT: 0.1 mg/dL (ref 0.0–0.3)
BILIRUBIN TOTAL: 0.4 mg/dL (ref 0.2–1.2)
Total Protein: 6.2 g/dL (ref 6.0–8.3)

## 2016-01-19 LAB — VITAMIN D 25 HYDROXY (VIT D DEFICIENCY, FRACTURES): VITD: 19.72 ng/mL — ABNORMAL LOW (ref 30.00–100.00)

## 2016-01-19 LAB — TSH: TSH: 1.15 u[IU]/mL (ref 0.35–4.50)

## 2016-01-19 MED ORDER — VITAMIN D (ERGOCALCIFEROL) 1.25 MG (50000 UNIT) PO CAPS: 50000.0000 [IU] | ORAL_CAPSULE | ORAL | Status: DC

## 2016-01-19 NOTE — Progress Notes (Signed)
   Subjective:    Patient ID: Kerry Lawson, female    DOB: 1970/10/19, 45 y.o.   MRN: [id number]  HPI CPE- UTD on GYN w/ Dr Karin Golden at [facility].  Sporadic exercise, pt plans to improve regularity of exercise.   Review of Systems Patient reports no vision/ hearing changes, adenopathy,fever, weight change,  persistant/recurrent hoarseness , swallowing issues, chest pain, palpitations, edema, persistant/recurrent cough, hemoptysis, dyspnea (rest/exertional/paroxysmal nocturnal), gastrointestinal bleeding (melena, rectal bleeding), abdominal pain, significant heartburn, bowel changes, GU symptoms (dysuria, hematuria, incontinence), Gyn symptoms (abnormal  bleeding, pain),  syncope, focal weakness, memory loss, numbness & tingling, skin/hair/nail changes, abnormal bruising or bleeding, anxiety, or depression.     Objective:   Physical Exam General Appearance:    Alert, cooperative, no distress, appears stated age  Head:    Normocephalic, without obvious abnormality, atraumatic  Eyes:    PERRL, conjunctiva/corneas clear, EOM's intact, fundi    benign, both eyes  Ears:    Normal TM's and external ear canals, both ears  Nose:   Nares normal, septum midline, mucosa normal, no drainage    or sinus tenderness  Throat:   Lips, mucosa, and tongue normal; teeth and gums normal  Neck:   Supple, symmetrical, trachea midline, no adenopathy;    Thyroid: no enlargement/tenderness/nodules  Back:     Symmetric, no curvature, ROM normal, no CVA tenderness  Lungs:     Clear to auscultation bilaterally, respirations unlabored  Chest Wall:    No tenderness or deformity   Heart:    Regular rate and rhythm, S1 and S2 normal, no murmur, rub   or gallop  Breast Exam:    Deferred to GYN  Abdomen:     Soft, non-tender, bowel sounds active all four quadrants,    no masses, no organomegaly  Genitalia:    Deferred to GYN  Rectal:    Extremities:   Extremities normal, atraumatic, no cyanosis or edema    Pulses:   2+ and symmetric all extremities  Skin:   Skin color, texture, turgor normal, no rashes or lesions  Lymph nodes:   Cervical, supraclavicular, and axillary nodes normal  Neurologic:   CNII-XII intact, normal strength, sensation and reflexes    throughout          Assessment & Plan:

## 2016-01-19 NOTE — Patient Instructions (Signed)
Follow up in 1 year or as needed We'll notify you of your lab results and make any changes if needed Continue to work on healthy diet and regular exercise- you can do it! Call with any questions or concerns Thanks for sticking with [place]! Have a great summer!!!

## 2016-01-19 NOTE — Addendum Note (Signed)
Addended by: Clyde Lundborg A on: 01/19/2016 09:42 AM   Modules accepted: Miquel Dunn

## 2016-01-19 NOTE — Addendum Note (Signed)
Addended by: Clyde Lundborg A on: 01/19/2016 10:02 AM   Modules accepted: Miquel Dunn

## 2016-01-19 NOTE — Progress Notes (Signed)
Pre visit review using our clinic review tool, if applicable. No additional management support is needed unless otherwise documented below in the visit note. 

## 2016-01-19 NOTE — Assessment & Plan Note (Signed)
Pt's PE WNL.  UTD on GYN.  Check labs.  Anticipatory guidance provided.  

## 2016-02-03 ENCOUNTER — Other Ambulatory Visit (INDEPENDENT_AMBULATORY_CARE_PROVIDER_SITE_OTHER): Payer: BC Managed Care – PPO

## 2016-02-03 DIAGNOSIS — D649 Anemia, unspecified: Secondary | ICD-10-CM | POA: Diagnosis not present

## 2016-02-03 LAB — FECAL OCCULT BLOOD, IMMUNOCHEMICAL: Fecal Occult Bld: NEGATIVE

## 2016-04-07 ENCOUNTER — Encounter: Payer: Self-pay | Admitting: General Practice

## 2016-04-07 ENCOUNTER — Other Ambulatory Visit: Payer: Self-pay | Admitting: Family Medicine

## 2016-04-07 NOTE — Telephone Encounter (Signed)
We cannot fill this medication. Pt is to continue OTC vitamin D 2000iu daily.

## 2016-05-28 ENCOUNTER — Other Ambulatory Visit: Payer: Self-pay | Admitting: Family Medicine

## 2016-05-28 DIAGNOSIS — Z1231 Encounter for screening mammogram for malignant neoplasm of breast: Secondary | ICD-10-CM

## 2016-06-09 ENCOUNTER — Ambulatory Visit
Admission: RE | Admit: 2016-06-09 | Discharge: 2016-06-09 | Disposition: A | Discharge status: Home / Self Care | Payer: BC Managed Care – PPO | Source: Ambulatory Visit | Attending: Family Medicine | Admitting: Family Medicine

## 2016-06-09 DIAGNOSIS — Z1231 Encounter for screening mammogram for malignant neoplasm of breast: Secondary | ICD-10-CM

## 2016-08-25 LAB — HM PAP SMEAR

## 2016-09-01 ENCOUNTER — Encounter: Payer: Self-pay | Admitting: General Practice

## 2017-01-20 ENCOUNTER — Telehealth: Payer: Self-pay | Admitting: General Practice

## 2017-01-20 ENCOUNTER — Encounter: Payer: BC Managed Care – PPO | Admitting: Family Medicine

## 2017-01-20 NOTE — Telephone Encounter (Signed)
Pt walked into the office 15 minutes past her CPE appointment. Per office policy she was rescheduled and charged a no show fee.

## 2017-04-25 ENCOUNTER — Encounter: Payer: BC Managed Care – PPO | Admitting: Family Medicine

## 2017-04-29 ENCOUNTER — Ambulatory Visit (INDEPENDENT_AMBULATORY_CARE_PROVIDER_SITE_OTHER): Payer: BC Managed Care – PPO | Admitting: Family Medicine

## 2017-04-29 ENCOUNTER — Encounter: Payer: Self-pay | Admitting: Family Medicine

## 2017-04-29 VITALS — BP 120/80 | HR 83 | Temp 98.0°F | Resp 16 | Ht 60.0 in | Wt 182.5 lb

## 2017-04-29 DIAGNOSIS — Z0000 Encounter for general adult medical examination without abnormal findings: Secondary | ICD-10-CM | POA: Diagnosis not present

## 2017-04-29 DIAGNOSIS — E559 Vitamin D deficiency, unspecified: Secondary | ICD-10-CM

## 2017-04-29 DIAGNOSIS — E669 Obesity, unspecified: Secondary | ICD-10-CM | POA: Diagnosis not present

## 2017-04-29 LAB — BASIC METABOLIC PANEL
BUN: 9 mg/dL (ref 6–23)
CALCIUM: 8.7 mg/dL (ref 8.4–10.5)
CO2: 25 mEq/L (ref 19–32)
CREATININE: 0.88 mg/dL (ref 0.40–1.20)
Chloride: 104 mEq/L (ref 96–112)
GFR: 88.16 mL/min (ref >60.00)
GLUCOSE: 90 mg/dL (ref 70–99)
Potassium: 4.2 mEq/L (ref 3.5–5.1)
Sodium: 136 mEq/L (ref 135–145)

## 2017-04-29 LAB — HEPATIC FUNCTION PANEL
ALBUMIN: 3.6 g/dL (ref 3.5–5.2)
ALT: 12 U/L (ref 0–35)
AST: 18 U/L (ref 0–37)
Alkaline Phosphatase: 61 U/L (ref 39–117)
Bilirubin, Direct: 0.1 mg/dL (ref 0.0–0.3)
TOTAL PROTEIN: 6.6 g/dL (ref 6.0–8.3)
Total Bilirubin: 0.4 mg/dL (ref 0.2–1.2)

## 2017-04-29 LAB — CBC WITH DIFFERENTIAL/PLATELET
BASOS ABS: 0.1 10*3/uL (ref 0.0–0.1)
Basophils Relative: 1.7 % (ref 0.0–3.0)
EOS ABS: 0.8 10*3/uL — AB (ref 0.0–0.7)
Eosinophils Relative: 11.1 % — ABNORMAL HIGH (ref 0.0–5.0)
HCT: 32.7 % — ABNORMAL LOW (ref 36.0–46.0)
Hemoglobin: 10.2 g/dL — ABNORMAL LOW (ref 12.0–15.0)
LYMPHS PCT: 20.8 % (ref 12.0–46.0)
Lymphs Abs: 1.6 10*3/uL (ref 0.7–4.0)
MCHC: 31.1 g/dL (ref 30.0–36.0)
MCV: 71.9 fl — ABNORMAL LOW (ref 78.0–100.0)
MONO ABS: 0.5 10*3/uL (ref 0.1–1.0)
Monocytes Relative: 6 % (ref 3.0–12.0)
Neutro Abs: 4.6 10*3/uL (ref 1.4–7.7)
Neutrophils Relative %: 60.4 % (ref 43.0–77.0)
PLATELETS: 334 10*3/uL (ref 150.0–400.0)
RBC: 4.54 Mil/uL (ref 3.87–5.11)
RDW: 18.3 % — AB (ref 11.5–15.5)
WBC: 7.5 10*3/uL (ref 4.0–10.5)

## 2017-04-29 LAB — LIPID PANEL
CHOLESTEROL: 170 mg/dL (ref 0–200)
HDL: 71.5 mg/dL (ref >39.00)
LDL CALC: 84 mg/dL (ref 0–99)
NonHDL: 98.89
Total CHOL/HDL Ratio: 2
Triglycerides: 75 mg/dL (ref 0.0–149.0)
VLDL: 15 mg/dL (ref 0.0–40.0)

## 2017-04-29 LAB — VITAMIN D 25 HYDROXY (VIT D DEFICIENCY, FRACTURES): VITD: 30.58 ng/mL (ref 30.00–100.00)

## 2017-04-29 LAB — TSH: TSH: 1.53 u[IU]/mL (ref 0.35–4.50)

## 2017-04-29 NOTE — Progress Notes (Signed)
   Subjective:    Patient ID: Kerry Lawson, female    DOB: 19-Mar-1971, 46 y.o.   MRN: [id number]  HPI CPE- UTD on pap, mammo w/ Dr Deatra Ina.  Pt has gained another 5 lbs since last visit.  BMI is now 35.64.  UTD on Tdap.  Declines flu.   Review of Systems Patient reports no vision/ hearing changes, adenopathy,fever, weight change,  persistant/recurrent hoarseness , swallowing issues, chest pain, palpitations, edema, persistant/recurrent cough, hemoptysis, dyspnea (rest/exertional/paroxysmal nocturnal), gastrointestinal bleeding (melena, rectal bleeding), abdominal pain, significant heartburn, bowel changes, GU symptoms (dysuria, hematuria, incontinence), Gyn symptoms (abnormal  bleeding, pain),  syncope, focal weakness, memory loss, numbness & tingling, skin/hair/nail changes, abnormal bruising or bleeding, anxiety, or depression.     Objective:   Physical Exam General Appearance:    Alert, cooperative, no distress, appears stated age  Head:    Normocephalic, without obvious abnormality, atraumatic  Eyes:    PERRL, conjunctiva/corneas clear, EOM's intact, fundi    benign, both eyes  Ears:    Normal TM's and external ear canals, both ears  Nose:   Nares normal, septum midline, mucosa normal, no drainage    or sinus tenderness  Throat:   Lips, mucosa, and tongue normal; teeth and gums normal  Neck:   Supple, symmetrical, trachea midline, no adenopathy;    Thyroid: no enlargement/tenderness/nodules  Back:     Symmetric, no curvature, ROM normal, no CVA tenderness  Lungs:     Clear to auscultation bilaterally, respirations unlabored  Chest Wall:    No tenderness or deformity   Heart:    Regular rate and rhythm, S1 and S2 normal, no murmur, rub   or gallop  Breast Exam:    Deferred to GYN  Abdomen:     Soft, non-tender, bowel sounds active all four quadrants,    no masses, no organomegaly  Genitalia:    Deferred to GYN  Rectal:    Extremities:   Extremities normal, atraumatic, no cyanosis  or edema  Pulses:   2+ and symmetric all extremities  Skin:   Skin color, texture, turgor normal, no rashes or lesions  Lymph nodes:   Cervical, supraclavicular, and axillary nodes normal  Neurologic:   CNII-XII intact, normal strength, sensation and reflexes    throughout          Assessment & Plan:

## 2017-04-29 NOTE — Assessment & Plan Note (Signed)
Pt's PE WNL w/ exception of obesity.  UTD on GYN, Tdap.  Check labs.  Anticipatory guidance provided.

## 2017-04-29 NOTE — Assessment & Plan Note (Signed)
Ongoing issue for pt.  She continues to gain weight.  Stressed need for healthy diet and regular exercise.  Check labs to risk stratify.  Will follow. 

## 2017-04-29 NOTE — Assessment & Plan Note (Signed)
Pt has hx of this.  Check labs.  Replete prn. 

## 2017-04-29 NOTE — Progress Notes (Signed)
Pre visit review using our clinic review tool, if applicable. No additional management support is needed unless otherwise documented below in the visit note. 

## 2017-04-29 NOTE — Patient Instructions (Signed)
Follow up in 1 year or as needed We'll notify you of your lab results and make any changes if needed Continue to work on healthy diet and regular exercise- you can do it! Call with any questions or concerns Happy Labor Day!!! 

## 2017-05-10 ENCOUNTER — Other Ambulatory Visit: Payer: Self-pay | Admitting: Family Medicine

## 2017-05-10 DIAGNOSIS — Z1231 Encounter for screening mammogram for malignant neoplasm of breast: Secondary | ICD-10-CM

## 2017-06-10 ENCOUNTER — Ambulatory Visit
Admission: RE | Admit: 2017-06-10 | Discharge: 2017-06-10 | Disposition: A | Discharge status: Home / Self Care | Payer: BC Managed Care – PPO | Source: Ambulatory Visit | Attending: Family Medicine | Admitting: Family Medicine

## 2017-06-10 DIAGNOSIS — Z1231 Encounter for screening mammogram for malignant neoplasm of breast: Secondary | ICD-10-CM

## 2018-01-20 ENCOUNTER — Other Ambulatory Visit: Payer: Self-pay | Admitting: Family Medicine

## 2018-01-20 ENCOUNTER — Other Ambulatory Visit: Payer: Self-pay | Admitting: *Deleted

## 2018-01-20 ENCOUNTER — Encounter: Payer: Self-pay | Admitting: Family Medicine

## 2018-01-20 DIAGNOSIS — Z1231 Encounter for screening mammogram for malignant neoplasm of breast: Secondary | ICD-10-CM

## 2018-01-20 DIAGNOSIS — N644 Mastodynia: Secondary | ICD-10-CM

## 2018-01-26 ENCOUNTER — Other Ambulatory Visit: Payer: Self-pay | Admitting: Family Medicine

## 2018-01-26 DIAGNOSIS — N644 Mastodynia: Secondary | ICD-10-CM

## 2018-01-31 ENCOUNTER — Ambulatory Visit
Admission: RE | Admit: 2018-01-31 | Discharge: 2018-01-31 | Disposition: A | Discharge status: Home / Self Care | Payer: BC Managed Care – PPO | Source: Ambulatory Visit | Attending: Family Medicine | Admitting: Family Medicine

## 2018-01-31 ENCOUNTER — Other Ambulatory Visit: Payer: Self-pay | Admitting: Family Medicine

## 2018-01-31 DIAGNOSIS — N644 Mastodynia: Secondary | ICD-10-CM

## 2018-01-31 DIAGNOSIS — Z1231 Encounter for screening mammogram for malignant neoplasm of breast: Secondary | ICD-10-CM

## 2018-03-26 ENCOUNTER — Other Ambulatory Visit: Payer: Self-pay

## 2018-03-26 ENCOUNTER — Encounter (HOSPITAL_COMMUNITY): Payer: Self-pay

## 2018-03-26 ENCOUNTER — Observation Stay (HOSPITAL_COMMUNITY)
Admission: AD | Admit: 2018-03-26 | Discharge: 2018-03-27 | Disposition: A | Discharge status: Home / Self Care | Payer: BC Managed Care – PPO | Source: Ambulatory Visit | Attending: Obstetrics & Gynecology | Admitting: Obstetrics & Gynecology

## 2018-03-26 DIAGNOSIS — N938 Other specified abnormal uterine and vaginal bleeding: Secondary | ICD-10-CM

## 2018-03-26 DIAGNOSIS — N939 Abnormal uterine and vaginal bleeding, unspecified: Principal | ICD-10-CM | POA: Diagnosis present

## 2018-03-26 LAB — CBC
HCT: 27.9 % — ABNORMAL LOW (ref 36.0–46.0)
HEMATOCRIT: 20.7 % — AB (ref 36.0–46.0)
Hemoglobin: 9.3 g/dL — ABNORMAL LOW (ref 12.0–15.0)
Hemoglobin: 6.9 g/dL — CL (ref 12.0–15.0)
MCH: 28.8 pg (ref 26.0–34.0)
MCH: 28.8 pg (ref 26.0–34.0)
MCHC: 33.3 g/dL (ref 30.0–36.0)
MCHC: 33.3 g/dL (ref 30.0–36.0)
MCV: 86.4 fL (ref 78.0–100.0)
MCV: 86.3 fL (ref 78.0–100.0)
PLATELETS: 169 10*3/uL (ref 150–400)
Platelets: 234 10*3/uL (ref 150–400)
RBC: 3.23 MIL/uL — ABNORMAL LOW (ref 3.87–5.11)
RBC: 2.4 MIL/uL — ABNORMAL LOW (ref 3.87–5.11)
RDW: 16.6 % — ABNORMAL HIGH (ref 11.5–15.5)
RDW: 16.5 % — AB (ref 11.5–15.5)
WBC: 11.4 10*3/uL — AB (ref 4.0–10.5)
WBC: 9.7 10*3/uL (ref 4.0–10.5)

## 2018-03-26 LAB — TYPE AND SCREEN
ABO/RH(D): O POS
ANTIBODY SCREEN: NEGATIVE

## 2018-03-26 LAB — HCG, SERUM, QUALITATIVE: Preg, Serum: NEGATIVE

## 2018-03-26 LAB — ABO/RH: ABO/RH(D): O POS

## 2018-03-26 MED ORDER — MEDROXYPROGESTERONE ACETATE 10 MG PO TABS
20.0000 mg | ORAL_TABLET | Freq: Once | ORAL | Status: DC
  Filled 2018-03-26: qty 2

## 2018-03-26 MED ORDER — ESTROGENS CONJUGATED 25 MG IJ SOLR
25.0000 mg | Freq: Once | INTRAMUSCULAR | Status: AC
  Administered 2018-03-27: 25 mg via INTRAVENOUS
  Filled 2018-03-26: qty 25

## 2018-03-26 MED ORDER — SODIUM CHLORIDE 0.9 % IV SOLN: INTRAVENOUS | Status: DC

## 2018-03-26 MED ORDER — ESTROGENS CONJUGATED 25 MG IJ SOLR
25.0000 mg | Freq: Once | INTRAMUSCULAR | Status: AC
  Administered 2018-03-26: 25 mg via INTRAVENOUS
  Filled 2018-03-26: qty 25

## 2018-03-26 MED ORDER — SODIUM CHLORIDE 0.9 % IV SOLN
INTRAVENOUS | Status: DC
  Administered 2018-03-26 – 2018-03-27 (×3): via INTRAVENOUS

## 2018-03-26 MED ORDER — SODIUM CHLORIDE 0.9 % IV BOLUS
1000.0000 mL | Freq: Once | INTRAVENOUS | Status: AC
  Administered 2018-03-26: 1000 mL via INTRAVENOUS

## 2018-03-26 MED ORDER — MEDROXYPROGESTERONE ACETATE 10 MG PO TABS
20.0000 mg | ORAL_TABLET | Freq: Every day | ORAL | Status: DC
  Administered 2018-03-26 – 2018-03-27 (×2): 20 mg via ORAL
  Filled 2018-03-26 (×2): qty 2

## 2018-03-26 MED ORDER — MISOPROSTOL 200 MCG PO TABS
400.0000 ug | ORAL_TABLET | Freq: Once | ORAL | Status: AC
  Administered 2018-03-26: 400 ug via BUCCAL
  Filled 2018-03-26: qty 2

## 2018-03-26 NOTE — Progress Notes (Signed)
Patient received 25 mg of IV Premarin 2 hours ago.  She is still having moderate to heavy bleeding.  Her pulse is 97, BP 90/60 range.  She is alert and in no distress.  Bimanual exam reveals a normal sized, non-tender uterus.  Moderate blood and clots were expressed.  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 I will approach this as uncontrolled bleeding from a large vessel in an otherwise thinned endometrium.  Plan:  Repeat premarin in 4 hours and check hemoglobin.  I will add cytotec 400 mcg now by buccal route to see if this has any benefit in this unusual case.  If Hb is <7 gm may consider transfusion (I discussed this with patient) if bleeding does not appear to be slowing.

## 2018-03-26 NOTE — MAU Provider Note (Signed)
Chief Complaint: Vaginal Bleeding; Fatigue; and Dizziness   First Provider Initiated Contact with Patient 03/26/18 1034     SUBJECTIVE HPI: Kerry Lawson is a 47 y.o. non pregnant female who presents to Maternity Admissions reporting vaginal bleeding. Vaginal bleeding started 2 days and became much heavier last night. Saturating overnight pad every hour and passing clots. Reports feeling dizzy and lightheaded, worse with lying down. Had 1 syncopal episode last night. Currently taking OCPs and denies any missed doses or changes in administration. Denies hx of bleeding this heavy and reports regular periods every month. Does have hx of anemia that she takes iron supplements for. Denies abdominal pain. No recent intercourse. Denies any other medical problems.    Past Medical History:  Diagnosis Date  . Anemia   . Blepharospasm    OB History  No data available   No past surgical history on file. Social History   Socioeconomic History  . Marital status: Single    Spouse name: Not on file  . Number of children: Not on file  . Years of education: Not on file  . Highest education level: Not on file  Occupational History  . Not on file  Social Needs  . Financial resource strain: Not on file  . Food insecurity:    Worry: Not on file    Inability: Not on file  . Transportation needs:    Medical: Not on file    Non-medical: Not on file  Tobacco Use  . Smoking status: Never Smoker  . Smokeless tobacco: Never Used  Substance and Sexual Activity  . Alcohol use: No  . Drug use: No  . Sexual activity: Yes  Lifestyle  . Physical activity:    Days per week: Not on file    Minutes per session: Not on file  . Stress: Not on file  Relationships  . Social connections:    Talks on phone: Not on file    Gets together: Not on file    Attends religious service: Not on file    Active member of club or organization: Not on file    Attends meetings of clubs or organizations: Not on file     Relationship status: Not on file  . Intimate partner violence:    Fear of current or ex partner: Not on file    Emotionally abused: Not on file    Physically abused: Not on file    Forced sexual activity: Not on file  Other Topics Concern  . Not on file  Social History Narrative   Lives with 4 kids-ages [phone]   Works as a Sports coach   No tobacco, no drugs, no etoh          Family History  Problem Relation Age of Onset  . Hypertension Mother   . Hypertension Father   . Diabetes Paternal Grandmother   . Breast cancer Neg Hx    No current facility-administered medications on file prior to encounter.    Current Outpatient Medications on File Prior to Encounter  Medication Sig Dispense Refill  . AZURETTE 0.15-0.02/0.01 MG (21/5) tablet Take 1 tablet by mouth daily.    Marland Kitchen EPINEPHrine 0.3 mg/0.3 mL IJ SOAJ injection Inject 0.3 mLs (0.3 mg total) into the muscle once. (Patient not taking: Reported on 01/19/2016) 2 Device 3  . ferrous sulfate 325 (65 FE) MG tablet Take 1 tablet (325 mg total) by mouth 3 (three) times daily with meals. 30 tablet 5  .  Vitamin D, Ergocalciferol, 2000 units CAPS Take by mouth.     No Known Allergies  I have reviewed patient's Past Medical Hx, Surgical Hx, Family Hx, Social Hx, medications and allergies.   Review of Systems  Constitutional: Negative.   Gastrointestinal: Negative for abdominal pain.  Genitourinary: Positive for menstrual problem and vaginal bleeding.  Neurological: Positive for dizziness, syncope and light-headedness.    OBJECTIVE Patient Vitals for the past 24 hrs:  BP Temp Temp src Pulse Resp SpO2 Weight  03/26/18 1802 (!) 109/55 98.2 F (36.8 C) Oral 89 20 100 % -  03/26/18 1657 103/61 - - (!) 101 19 - -  03/26/18 1529 (!) 96/56 - - 99 19 100 % -  03/26/18 1228 119/64 - - 92 19 - -  03/26/18 1039 120/72 - - 91 - - -  03/26/18 1029 (!) 89/63 97.8 F (36.6 C) Oral (!) 108 20 100 % 160 lb (72.6  kg)    Orthostatic VS for the past 24 hrs:  BP- Lying Pulse- Lying BP- Sitting Pulse- Sitting BP- Standing at 0 minutes Pulse- Standing at 0 minutes  03/26/18 1048 103/65 89 100/65 114 94/63 134     Constitutional: Well-developed, well-nourished female in no acute distress.  Cardiovascular: normal rate & rhythm, no murmur Respiratory: normal rate and effort. Lung sounds clear throughout GI: Abd soft, non-tender, Pos BS x 4. No guarding or rebound tenderness MS: Extremities nontender, no edema, normal ROM Neurologic: Alert and oriented x 4.  GU:     SPECULUM EXAM: NEFG, cervix pink & smooth. Golf ball sized blood clots x3 cleared from vaginal canal.  Continued trickle of dark red blood from os. Cervix not friable.   BIMANUAL: No CMT. cervix closed; uterus normal size, no adnexal tenderness or masses.    LAB RESULTS Results for orders placed or performed during the hospital encounter of 03/26/18 (from the past 24 hour(s))  CBC     Status: Abnormal   Collection Time: 03/26/18 11:18 AM  Result Value Ref Range   WBC 11.4 (H) 4.0 - 10.5 K/uL   RBC 3.23 (L) 3.87 - 5.11 MIL/uL   Hemoglobin 9.3 (L) 12.0 - 15.0 g/dL   HCT 27.9 (L) 36.0 - 46.0 %   MCV 86.4 78.0 - 100.0 fL   MCH 28.8 26.0 - 34.0 pg   MCHC 33.3 30.0 - 36.0 g/dL   RDW 16.6 (H) 11.5 - 15.5 %   Platelets 234 150 - 400 K/uL  Type and screen     Status: None   Collection Time: 03/26/18 11:47 AM  Result Value Ref Range   ABO/RH(D) O POS    Antibody Screen NEG    Sample Expiration      03/29/2018 Performed at [facility], [address] [place], [place] [address]   hCG, serum, qualitative     Status: None   Collection Time: 03/26/18 11:47 AM  Result Value Ref Range   Preg, Serum NEGATIVE NEGATIVE  ABO/Rh     Status: None   Collection Time: 03/26/18 11:47 AM  Result Value Ref Range   ABO/RH(D)      O POS Performed at [facility], [address], [place], [place] [address]   CBC     Status: Abnormal    Collection Time: 03/26/18  5:34 PM  Result Value Ref Range   WBC 9.7 4.0 - 10.5 K/uL   RBC 2.40 (L) 3.87 - 5.11 MIL/uL   Hemoglobin 6.9 (LL) 12.0 - 15.0 g/dL  HCT 20.7 (L) 36.0 - 46.0 %   MCV 86.3 78.0 - 100.0 fL   MCH 28.8 26.0 - 34.0 pg   MCHC 33.3 30.0 - 36.0 g/dL   RDW 16.5 (H) 11.5 - 15.5 %   Platelets 169 150 - 400 K/uL    IMAGING No results found.  MAU COURSE Orders Placed This Encounter  Procedures  . CBC  . hCG, serum, qualitative  . CBC  . Type and screen  . ABO/Rh   Meds ordered this encounter  Medications  . sodium chloride 0.9 % bolus 1,000 mL  . sodium chloride 0.9 % bolus 1,000 mL  . conjugated estrogens (PREMARIN) injection 25 mg  . 0.9 %  sodium chloride infusion  . misoprostol (CYTOTEC) tablet 400 mcg  . conjugated estrogens (PREMARIN) injection 25 mg  . medroxyPROGESTERone (PROVERA) tablet 20 mg    MDM Hypotensive & tachycardic in triage. Brought directly to rm#[id number]. Patient orthostatic. IV NS bolus started. CBC, T&S & HCG qual collected.  HCG negative CBC -- hemoglobin 9.3 VS improved with IV fluids but patient continues to feel dizzy. Up to bathroom and passed clots in toilet, required assistance by to room.   C/w Dr. Deatra Ina. Will continue IV fluids (pt given 2 liters of NS & will continue at 133ml/hr). IV premarin 25 mg ordered. Reassess bleeding, VS, and CBC 4 hrs after premarin administration.  Dr. Deatra Ina on unit to speak with patient regarding POC  Repeat Hgb down to 6.9. VS stable but patient doesn't tolerate position changes. Bleeding appears to have slowed down. 3x5in of blood smear in pad x 1+ hr. Dr. Deatra Ina updated.   ASSESSMENT 1. DUB (dysfunctional uterine bleeding)     PLAN Orders received from Dr. Deatra Ina. Will obs patient x 24 hrs. Continue provera and premarin for bleeding. Continue IV fluids. Reassess hemoglobin in the morning.   Jorje Guild, NP 03/26/2018  6:27 PM

## 2018-03-26 NOTE — MAU Note (Signed)
CRITICAL VALUE STICKER  CRITICAL VALUE: Hemoglobin 6.9  RECEIVER (on-site recipient of call): Urian Martenson rossini robinson rnc  DATE & TIME NOTIFIED: 03/26/18 @1746   MESSENGER (representative from lab): Alfreida  MD NOTIFIED: Jorje Guild NP  TIME OF NOTIFICATION: 3685  RESPONSE: Will go reassess patient and call Dr. Deatra Ina

## 2018-03-26 NOTE — MAU Note (Signed)
Cycle on for the 2nd time this month  Changes pad every hour, passing golfball and tennis ball sized clots  Feels weak, fainted x2 last night  No pain

## 2018-03-26 NOTE — Progress Notes (Signed)
Repeat Hb is 6.9 gm.  It appears that her bleeding is less.  Her pulse is 89 and BP is 106/58.  She is still orthostatic but she is voiding normal amounts.    Imp:  I believe the Hb of 6.9 gms. is consistent with re-equilibration from acute blood loss over the last 24 hours and aggressive rehydration in MAU. Her bleeding appears to be responding to therapy.  Plan:  I will admit her for 24 hours observation to follow her vital signs, vaginal bleeding, and recheck hemoglobin in the morning.  I do not believe that transfusion is necessary at this time since her bleeding seems to be slowing.  I will give her 2 more doses of IV premarin and will start Provera 20 mg daily (1st dose tonight).

## 2018-03-27 LAB — CBC
HCT: 17.6 % — ABNORMAL LOW (ref 36.0–46.0)
Hemoglobin: 5.9 g/dL — CL (ref 12.0–15.0)
MCH: 29.1 pg (ref 26.0–34.0)
MCHC: 33.5 g/dL (ref 30.0–36.0)
MCV: 86.7 fL (ref 78.0–100.0)
PLATELETS: 171 10*3/uL (ref 150–400)
RBC: 2.03 MIL/uL — ABNORMAL LOW (ref 3.87–5.11)
RDW: 17.1 % — AB (ref 11.5–15.5)
WBC: 10.6 10*3/uL — AB (ref 4.0–10.5)

## 2018-03-27 MED ORDER — MEDROXYPROGESTERONE ACETATE 10 MG PO TABS: 20.0000 mg | ORAL_TABLET | Freq: Every day | ORAL | 2 refills | Status: DC

## 2018-03-27 NOTE — Progress Notes (Signed)
Discharge teaching complete with pt. Pt understood all information and did not have any questions. Pt discharged home to family. 

## 2018-03-27 NOTE — Discharge Summary (Signed)
Admission date:  March 26, 2018 Discharge date:   March 27, 2018   Admitting diagnosis:  Abnormal Uterine Bleeding with hemorrhage, anemia, and hypovolemia.  Discharge diagnosis:  Same  Condition on Discharge:  Stable  Hospital Course:  Presented to MAU with heavy vaginal bleeding for 12 hours and orthostatic syncope.  Treated with IV Premarin and cytotec with good response.  Admitted for observation and bleeding has stopped.  This morning she is tachycardic (100bpm) but her BP is normal and she no longer has any orthostatic symptoms.  Her hemoglobin was 5.9 gm this morning.  We discussed transfusion but since she is feeling better and is not bleeding we agreed not to transfuse.    She is discharged on a regular diet, limited activities with special precautions in shower to avoid syncope, Provera 20 mg daily for 30 days, Iron supplement twice daily.  She will return to office in 4 days for repeat CBC.  She was instructed to return to [facility] by ambulance immediately if moderate bleeding recurs.

## 2018-03-27 NOTE — Progress Notes (Signed)
CRITICAL VALUE ALERT  Critical Value:  hgb 5.9  Date & Time Notied:  03/27/18 0722  Provider Notified: Lia Foyer  Orders Received/Actions taken: MD coming to hospital to see patient

## 2018-05-02 ENCOUNTER — Other Ambulatory Visit: Payer: Self-pay

## 2018-05-02 ENCOUNTER — Ambulatory Visit (INDEPENDENT_AMBULATORY_CARE_PROVIDER_SITE_OTHER): Payer: BC Managed Care – PPO | Admitting: Family Medicine

## 2018-05-02 ENCOUNTER — Encounter: Payer: Self-pay | Admitting: Family Medicine

## 2018-05-02 VITALS — BP 121/82 | HR 80 | Temp 97.9°F | Resp 16 | Ht 60.0 in | Wt 166.2 lb

## 2018-05-02 DIAGNOSIS — E669 Obesity, unspecified: Secondary | ICD-10-CM

## 2018-05-02 DIAGNOSIS — Z0000 Encounter for general adult medical examination without abnormal findings: Secondary | ICD-10-CM | POA: Diagnosis not present

## 2018-05-02 DIAGNOSIS — E559 Vitamin D deficiency, unspecified: Secondary | ICD-10-CM

## 2018-05-02 LAB — BASIC METABOLIC PANEL
BUN: 12 mg/dL (ref 6–23)
CALCIUM: 9.2 mg/dL (ref 8.4–10.5)
CHLORIDE: 104 meq/L (ref 96–112)
CO2: 25 meq/L (ref 19–32)
Creatinine, Ser: 0.94 mg/dL (ref 0.40–1.20)
GFR: 81.36 mL/min (ref >60.00)
Glucose, Bld: 81 mg/dL (ref 70–99)
POTASSIUM: 4.3 meq/L (ref 3.5–5.1)
SODIUM: 139 meq/L (ref 135–145)

## 2018-05-02 LAB — LIPID PANEL
CHOL/HDL RATIO: 2
Cholesterol: 154 mg/dL (ref 0–200)
HDL: 67.2 mg/dL (ref >39.00)
LDL CALC: 75 mg/dL (ref 0–99)
NonHDL: 86.44
TRIGLYCERIDES: 58 mg/dL (ref 0.0–149.0)
VLDL: 11.6 mg/dL (ref 0.0–40.0)

## 2018-05-02 LAB — CBC WITH DIFFERENTIAL/PLATELET
BASOS PCT: 1.8 % (ref 0.0–3.0)
Basophils Absolute: 0.1 10*3/uL (ref 0.0–0.1)
EOS ABS: 0.5 10*3/uL (ref 0.0–0.7)
Eosinophils Relative: 8.5 % — ABNORMAL HIGH (ref 0.0–5.0)
HEMATOCRIT: 36.4 % (ref 36.0–46.0)
Hemoglobin: 11.7 g/dL — ABNORMAL LOW (ref 12.0–15.0)
LYMPHS ABS: 1.3 10*3/uL (ref 0.7–4.0)
LYMPHS PCT: 22.9 % (ref 12.0–46.0)
MCHC: 32.1 g/dL (ref 30.0–36.0)
MCV: 85.8 fl (ref 78.0–100.0)
Monocytes Absolute: 0.3 10*3/uL (ref 0.1–1.0)
Monocytes Relative: 4.9 % (ref 3.0–12.0)
NEUTROS ABS: 3.5 10*3/uL (ref 1.4–7.7)
Neutrophils Relative %: 61.9 % (ref 43.0–77.0)
PLATELETS: 320 10*3/uL (ref 150.0–400.0)
RBC: 4.25 Mil/uL (ref 3.87–5.11)
RDW: 16.6 % — AB (ref 11.5–15.5)
WBC: 5.6 10*3/uL (ref 4.0–10.5)

## 2018-05-02 LAB — HEPATIC FUNCTION PANEL
ALK PHOS: 61 U/L (ref 39–117)
ALT: 11 U/L (ref 0–35)
AST: 17 U/L (ref 0–37)
Albumin: 3.9 g/dL (ref 3.5–5.2)
BILIRUBIN DIRECT: 0.1 mg/dL (ref 0.0–0.3)
BILIRUBIN TOTAL: 0.3 mg/dL (ref 0.2–1.2)
Total Protein: 6.6 g/dL (ref 6.0–8.3)

## 2018-05-02 LAB — TSH: TSH: 1.41 u[IU]/mL (ref 0.35–4.50)

## 2018-05-02 LAB — VITAMIN D 25 HYDROXY (VIT D DEFICIENCY, FRACTURES): VITD: 30.96 ng/mL (ref 30.00–100.00)

## 2018-05-02 NOTE — Progress Notes (Signed)
   Subjective:    Patient ID: Kerry Lawson, female    DOB: 1971-03-02, 47 y.o.   MRN: [id number]  HPI CPE- UTD on pap, mammo, Tdap.  Declines flu.  Pt has lost 17 lbs since last visit.  Pt is now exercising and working on healthy diet.   Review of Systems Patient reports no vision/ hearing changes, adenopathy,fever, weight change,  persistant/recurrent hoarseness , swallowing issues, chest pain, palpitations, edema, persistant/recurrent cough, hemoptysis, dyspnea (rest/exertional/paroxysmal nocturnal), gastrointestinal bleeding (melena, rectal bleeding), abdominal pain, significant heartburn, bowel changes, GU symptoms (dysuria, hematuria, incontinence), Gyn symptoms (abnormal  bleeding, pain), focal weakness, memory loss, numbness & tingling, skin/hair/nail changes, abnormal bruising, anxiety, or depression.   + syncope due to DUB    Objective:   Physical Exam General Appearance:    Alert, cooperative, no distress, appears stated age  Head:    Normocephalic, without obvious abnormality, atraumatic  Eyes:    PERRL, conjunctiva/corneas clear, EOM's intact, fundi    benign, both eyes  Ears:    Normal TM's and external ear canals, both ears  Nose:   Nares normal, septum midline, mucosa normal, no drainage    or sinus tenderness  Throat:   Lips, mucosa, and tongue normal; teeth and gums normal  Neck:   Supple, symmetrical, trachea midline, no adenopathy;    Thyroid: no enlargement/tenderness/nodules  Back:     Symmetric, no curvature, ROM normal, no CVA tenderness  Lungs:     Clear to auscultation bilaterally, respirations unlabored  Chest Wall:    No tenderness or deformity   Heart:    Regular rate and rhythm, S1 and S2 normal, no murmur, rub   or gallop  Breast Exam:    Deferred to GYN  Abdomen:     Soft, non-tender, bowel sounds active all four quadrants,    no masses, no organomegaly  Genitalia:    Deferred to GYN  Rectal:    Extremities:   Extremities normal, atraumatic, no  cyanosis or edema  Pulses:   2+ and symmetric all extremities  Skin:   Skin color, texture, turgor normal, no rashes or lesions  Lymph nodes:   Cervical, supraclavicular, and axillary nodes normal  Neurologic:   CNII-XII intact, normal strength, sensation and reflexes    throughout          Assessment & Plan:

## 2018-05-02 NOTE — Assessment & Plan Note (Signed)
Pt has lost 17 lbs since last visit.  Applauded her efforts.  Check labs to risk stratify.  Will follow.

## 2018-05-02 NOTE — Patient Instructions (Signed)
Follow up in 1 year or as needed We'll notify you of your lab results and make any changes if needed Keep up the good work on healthy diet and regular exercise- you look great!! Call with any questions or concerns Happy Fall!!!

## 2018-05-02 NOTE — Assessment & Plan Note (Signed)
Pt's PE WNL.  UTD on GYN, Tdap.  Declines flu.  Check labs.  Anticipatory guidance provided.

## 2018-05-02 NOTE — Assessment & Plan Note (Signed)
Pt has hx of this.  Check labs and replete prn. 

## 2018-05-30 ENCOUNTER — Other Ambulatory Visit: Payer: Self-pay | Admitting: Obstetrics and Gynecology

## 2018-06-08 ENCOUNTER — Other Ambulatory Visit: Payer: Self-pay

## 2018-06-08 ENCOUNTER — Encounter (HOSPITAL_COMMUNITY): Payer: Self-pay | Admitting: *Deleted

## 2018-06-12 ENCOUNTER — Ambulatory Visit
Admission: RE | Admit: 2018-06-12 | Discharge: 2018-06-12 | Disposition: A | Discharge status: Home / Self Care | Payer: BC Managed Care – PPO | Source: Ambulatory Visit | Attending: Family Medicine | Admitting: Family Medicine

## 2018-06-12 ENCOUNTER — Encounter: Payer: Self-pay | Admitting: Radiology

## 2018-06-12 DIAGNOSIS — Z1231 Encounter for screening mammogram for malignant neoplasm of breast: Secondary | ICD-10-CM

## 2018-06-27 ENCOUNTER — Ambulatory Visit (HOSPITAL_COMMUNITY): Payer: BC Managed Care – PPO | Admitting: Certified Registered Nurse Anesthetist

## 2018-06-27 ENCOUNTER — Other Ambulatory Visit: Payer: Self-pay

## 2018-06-27 ENCOUNTER — Ambulatory Visit (HOSPITAL_COMMUNITY)
Admission: AD | Admit: 2018-06-27 | Discharge: 2018-06-27 | Disposition: A | Discharge status: Home / Self Care | Payer: BC Managed Care – PPO | Source: Ambulatory Visit | Attending: Obstetrics and Gynecology | Admitting: Obstetrics and Gynecology

## 2018-06-27 ENCOUNTER — Encounter (HOSPITAL_COMMUNITY): Payer: Self-pay

## 2018-06-27 ENCOUNTER — Encounter (HOSPITAL_COMMUNITY)
Admission: AD | Disposition: A | Discharge status: Home / Self Care | Payer: Self-pay | Source: Ambulatory Visit | Attending: Obstetrics and Gynecology

## 2018-06-27 DIAGNOSIS — N920 Excessive and frequent menstruation with regular cycle: Secondary | ICD-10-CM | POA: Diagnosis present

## 2018-06-27 DIAGNOSIS — D259 Leiomyoma of uterus, unspecified: Secondary | ICD-10-CM | POA: Insufficient documentation

## 2018-06-27 DIAGNOSIS — N840 Polyp of corpus uteri: Secondary | ICD-10-CM | POA: Insufficient documentation

## 2018-06-27 DIAGNOSIS — E669 Obesity, unspecified: Secondary | ICD-10-CM | POA: Diagnosis not present

## 2018-06-27 DIAGNOSIS — Z6832 Body mass index (BMI) 32.0-32.9, adult: Secondary | ICD-10-CM | POA: Insufficient documentation

## 2018-06-27 HISTORY — PX: DILATATION & CURETTAGE/HYSTEROSCOPY WITH MYOSURE: SHX6511

## 2018-06-27 LAB — CBC
HEMATOCRIT: 42.9 % (ref 36.0–46.0)
HEMOGLOBIN: 13.9 g/dL (ref 12.0–15.0)
MCH: 27.9 pg (ref 26.0–34.0)
MCHC: 32.4 g/dL (ref 30.0–36.0)
MCV: 86.1 fL (ref 80.0–100.0)
Platelets: 227 10*3/uL (ref 150–400)
RBC: 4.98 MIL/uL (ref 3.87–5.11)
RDW: 14.4 % (ref 11.5–15.5)
WBC: 7.2 10*3/uL (ref 4.0–10.5)
nRBC: 0 % (ref 0.0–0.2)

## 2018-06-27 LAB — PREGNANCY, URINE: PREG TEST UR: NEGATIVE

## 2018-06-27 LAB — TYPE AND SCREEN
ABO/RH(D): O POS
ANTIBODY SCREEN: NEGATIVE

## 2018-06-27 SURGERY — DILATATION & CURETTAGE/HYSTEROSCOPY WITH MYOSURE
Anesthesia: General | Site: Vagina

## 2018-06-27 MED ORDER — LIDOCAINE HCL (CARDIAC) PF 100 MG/5ML IV SOSY
PREFILLED_SYRINGE | INTRAVENOUS | Status: DC | PRN
  Administered 2018-06-27: 100 mg via INTRAVENOUS

## 2018-06-27 MED ORDER — FENTANYL CITRATE (PF) 100 MCG/2ML IJ SOLN
INTRAMUSCULAR | Status: AC
  Filled 2018-06-27: qty 2

## 2018-06-27 MED ORDER — MIDAZOLAM HCL 2 MG/2ML IJ SOLN
INTRAMUSCULAR | Status: AC
  Filled 2018-06-27: qty 2

## 2018-06-27 MED ORDER — DEXAMETHASONE SODIUM PHOSPHATE 4 MG/ML IJ SOLN
INTRAMUSCULAR | Status: AC
  Filled 2018-06-27: qty 1

## 2018-06-27 MED ORDER — PROPOFOL 10 MG/ML IV BOLUS
INTRAVENOUS | Status: DC | PRN
  Administered 2018-06-27: 150 mg via INTRAVENOUS

## 2018-06-27 MED ORDER — SCOPOLAMINE 1 MG/3DAYS TD PT72
1.0000 | MEDICATED_PATCH | Freq: Once | TRANSDERMAL | Status: DC
  Administered 2018-06-27: 1.5 mg via TRANSDERMAL

## 2018-06-27 MED ORDER — GLYCOPYRROLATE 0.2 MG/ML IJ SOLN
INTRAMUSCULAR | Status: DC | PRN
  Administered 2018-06-27: 0.2 mg via INTRAVENOUS

## 2018-06-27 MED ORDER — PROPOFOL 10 MG/ML IV BOLUS
INTRAVENOUS | Status: AC
  Filled 2018-06-27: qty 20

## 2018-06-27 MED ORDER — ONDANSETRON HCL 4 MG/2ML IJ SOLN
INTRAMUSCULAR | Status: DC | PRN
  Administered 2018-06-27: 4 mg via INTRAVENOUS

## 2018-06-27 MED ORDER — KETOROLAC TROMETHAMINE 30 MG/ML IJ SOLN
INTRAMUSCULAR | Status: AC
  Filled 2018-06-27: qty 1

## 2018-06-27 MED ORDER — LACTATED RINGERS IV SOLN
INTRAVENOUS | Status: DC
  Administered 2018-06-27: 125 mL/h via INTRAVENOUS
  Administered 2018-06-27: 13:00:00 via INTRAVENOUS

## 2018-06-27 MED ORDER — DEXAMETHASONE SODIUM PHOSPHATE 10 MG/ML IJ SOLN
INTRAMUSCULAR | Status: DC | PRN
  Administered 2018-06-27: 10 mg via INTRAVENOUS

## 2018-06-27 MED ORDER — MEPERIDINE HCL 25 MG/ML IJ SOLN: 6.2500 mg | INTRAMUSCULAR | Status: DC | PRN

## 2018-06-27 MED ORDER — LIDOCAINE HCL 1 % IJ SOLN
INTRAMUSCULAR | Status: AC
  Filled 2018-06-27: qty 20

## 2018-06-27 MED ORDER — ACETAMINOPHEN 325 MG PO TABS: 325.0000 mg | ORAL_TABLET | ORAL | Status: DC | PRN

## 2018-06-27 MED ORDER — SODIUM CHLORIDE 0.9 % IR SOLN
Status: DC | PRN
  Administered 2018-06-27: 3000 mL

## 2018-06-27 MED ORDER — KETOROLAC TROMETHAMINE 30 MG/ML IJ SOLN: 30.0000 mg | Freq: Once | INTRAMUSCULAR | Status: DC | PRN

## 2018-06-27 MED ORDER — FENTANYL CITRATE (PF) 100 MCG/2ML IJ SOLN: 25.0000 ug | INTRAMUSCULAR | Status: DC | PRN

## 2018-06-27 MED ORDER — OXYCODONE HCL 5 MG/5ML PO SOLN: 5.0000 mg | Freq: Once | ORAL | Status: DC | PRN

## 2018-06-27 MED ORDER — ACETAMINOPHEN 160 MG/5ML PO SOLN: 325.0000 mg | ORAL | Status: DC | PRN

## 2018-06-27 MED ORDER — KETOROLAC TROMETHAMINE 30 MG/ML IJ SOLN
INTRAMUSCULAR | Status: DC | PRN
  Administered 2018-06-27: 30 mg via INTRAVENOUS

## 2018-06-27 MED ORDER — OXYCODONE HCL 5 MG PO TABS: 5.0000 mg | ORAL_TABLET | Freq: Once | ORAL | Status: DC | PRN

## 2018-06-27 MED ORDER — ONDANSETRON HCL 4 MG/2ML IJ SOLN: 4.0000 mg | Freq: Once | INTRAMUSCULAR | Status: DC | PRN

## 2018-06-27 MED ORDER — LIDOCAINE HCL (CARDIAC) PF 100 MG/5ML IV SOSY
PREFILLED_SYRINGE | INTRAVENOUS | Status: AC
  Filled 2018-06-27: qty 5

## 2018-06-27 MED ORDER — LIDOCAINE HCL 1 % IJ SOLN
INTRAMUSCULAR | Status: DC | PRN
  Administered 2018-06-27: 17 mL

## 2018-06-27 MED ORDER — FENTANYL CITRATE (PF) 100 MCG/2ML IJ SOLN
INTRAMUSCULAR | Status: DC | PRN
  Administered 2018-06-27 (×2): 100 ug via INTRAVENOUS

## 2018-06-27 MED ORDER — SCOPOLAMINE 1 MG/3DAYS TD PT72
MEDICATED_PATCH | TRANSDERMAL | Status: AC
  Filled 2018-06-27: qty 1

## 2018-06-27 MED ORDER — MIDAZOLAM HCL 2 MG/2ML IJ SOLN
INTRAMUSCULAR | Status: DC | PRN
  Administered 2018-06-27: 2 mg via INTRAVENOUS

## 2018-06-27 MED ORDER — ONDANSETRON HCL 4 MG/2ML IJ SOLN
INTRAMUSCULAR | Status: AC
  Filled 2018-06-27: qty 2

## 2018-06-27 MED ORDER — CEFAZOLIN SODIUM-DEXTROSE 2-3 GM-%(50ML) IV SOLR
INTRAVENOUS | Status: DC | PRN
  Administered 2018-06-27: 2 g via INTRAVENOUS

## 2018-06-27 SURGICAL SUPPLY — 16 items
CANISTER SUCT 3000ML PPV (MISCELLANEOUS) ×6 IMPLANT
CATH ROBINSON RED A/P 16FR (CATHETERS) ×3 IMPLANT
DEVICE MYOSURE LITE (MISCELLANEOUS) IMPLANT
DEVICE MYOSURE REACH (MISCELLANEOUS) ×3 IMPLANT
DILATOR CANAL MILEX (MISCELLANEOUS) IMPLANT
FILTER ARTHROSCOPY CONVERTOR (FILTER) ×3 IMPLANT
GLOVE BIOGEL PI IND STRL 7.0 (GLOVE) ×1 IMPLANT
GLOVE BIOGEL PI INDICATOR 7.0 (GLOVE) ×2
GLOVE ECLIPSE 7.0 STRL STRAW (GLOVE) ×3 IMPLANT
GOWN STRL REUS W/TWL LRG LVL3 (GOWN DISPOSABLE) ×6 IMPLANT
PACK VAGINAL MINOR WOMEN LF (CUSTOM PROCEDURE TRAY) ×3 IMPLANT
PAD OB MATERNITY 4.3X12.25 (PERSONAL CARE ITEMS) ×3 IMPLANT
SEAL ROD LENS SCOPE MYOSURE (ABLATOR) ×3 IMPLANT
TOWEL OR 17X24 6PK STRL BLUE (TOWEL DISPOSABLE) ×6 IMPLANT
TUBING AQUILEX INFLOW (TUBING) ×3 IMPLANT
TUBING AQUILEX OUTFLOW (TUBING) ×3 IMPLANT

## 2018-06-27 NOTE — Anesthesia Procedure Notes (Signed)
Procedure Name: LMA Insertion Date/Time: 06/27/2018 12:34 PM Performed by: Flossie Dibble, CRNA Pre-anesthesia Checklist: Patient identified, Patient being monitored, Emergency Drugs available, Timeout performed and Suction available Patient Re-evaluated:Patient Re-evaluated prior to induction Oxygen Delivery Method: Circle System Utilized Preoxygenation: Pre-oxygenation with 100% oxygen Induction Type: IV induction Ventilation: Mask ventilation without difficulty LMA: LMA inserted LMA Size: 4.0 Number of attempts: 1 Placement Confirmation: positive ETCO2 and breath sounds checked- equal and bilateral Tube secured with: Tape Dental Injury: Teeth and Oropharynx as per pre-operative assessment

## 2018-06-27 NOTE — H&P (Signed)
Kerry Lawson is an 47 y.o. black female who presents for a hysteroscopy/D&C/possible resection of polyp.Pt has known small fibroids. She is having menorrhagia and underwent a SHG. The procedure indicated that she has a 2cm polyp Chief Complaint: HPI:  Past Medical History:  Diagnosis Date  . Anemia   . Blepharospasm    eye twitches    Past Surgical History:  Procedure Laterality Date  . WISDOM TOOTH EXTRACTION  1997    Family History  Problem Relation Age of Onset  . Hypertension Mother   . Hypertension Father   . Diabetes Paternal Grandmother   . Breast cancer Neg Hx    Social History:  reports that she has never smoked. She has never used smokeless tobacco. She reports that she does not drink alcohol or use drugs.  Allergies:  Allergies  Allergen Reactions  . Other Anaphylaxis, Hives and Itching    Tree nuts    Medications Prior to Admission  Medication Sig Dispense Refill  . cetirizine (ZYRTEC) 10 MG tablet Take 10 mg by mouth at bedtime.     Marland Kitchen desogestrel-ethinyl estradiol (VIORELE) 0.15-0.02/0.01 MG (21/5) tablet Take 1 tablet by mouth at bedtime.     Marland Kitchen EPINEPHrine 0.3 mg/0.3 mL IJ SOAJ injection Inject 0.3 mLs (0.3 mg total) into the muscle once. 2 Device 3  . ferrous sulfate 325 (65 FE) MG tablet Take 1 tablet (325 mg total) by mouth 3 (three) times daily with meals. 30 tablet 5  . Multiple Vitamin (MULTIVITAMIN WITH MINERALS) TABS tablet Take 1 tablet by mouth daily.    . Vitamin D, Ergocalciferol, 2000 units CAPS Take 1 capsule by mouth daily.          Blood pressure 113/88, pulse 73, temperature 98 F (36.7 C), temperature source Oral, resp. rate 16, height 5' (1.524 m), weight 75.3 kg, SpO2 100 %. General appearance: alert and cooperative Abdomen: soft, non-tender; bowel sounds normal; no masses,  no organomegaly   Lab Results  Component Value Date   WBC 7.2 06/27/2018   HGB 13.9 06/27/2018   HCT 42.9 06/27/2018   MCV 86.1 06/27/2018   PLT 227  06/27/2018   Lab Results  Component Value Date   PREGTESTUR NEGATIVE 06/27/2018   PREGSERUM NEGATIVE 03/26/2018    Patient Active Problem List   Diagnosis Date Noted  . Abnormal uterine bleeding (AUB) 03/26/2018  . Vitamin D deficiency 04/29/2017  . Obesity (BMI 30-39.9) 04/29/2017  . 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 Blepharospasm 01/13/2015  . Anemia 05/31/2011  . General medical examination 05/31/2011  . Dizziness 05/31/2011  . SYNCOPE 09/03/2008   IMP/ Menorrhagia         Suspected uterine polyp Plan [facility] OR  Kynzee Devinney E 06/27/2018, 11:40 AM

## 2018-06-27 NOTE — Anesthesia Preprocedure Evaluation (Signed)
Anesthesia Evaluation  Patient identified by MRN, date of birth, ID band Patient awake    Airway Mallampati: I       Dental no notable dental hx. (+) Teeth Intact   Pulmonary neg pulmonary ROS,    Pulmonary exam normal breath sounds clear to auscultation       Cardiovascular negative cardio ROS Normal cardiovascular exam Rhythm:Regular Rate:Normal     Neuro/Psych negative psych ROS   GI/Hepatic negative GI ROS, Neg liver ROS,   Endo/Other  negative endocrine ROS  Renal/GU negative Renal ROS  negative genitourinary   Musculoskeletal negative musculoskeletal ROS (+)   Abdominal (+) + obese,   Peds  Hematology   Anesthesia Other Findings   Reproductive/Obstetrics negative OB ROS                             Anesthesia Physical Anesthesia Plan  ASA: II  Anesthesia Plan: General   Post-op Pain Management:    Induction:   PONV Risk Score and Plan: 4 or greater and Ondansetron, Dexamethasone, Scopolamine patch - Pre-op and Midazolam  Airway Management Planned: LMA  Additional Equipment:   Intra-op Plan:   Post-operative Plan: Extubation in OR  Informed Consent: I have reviewed the patients History and Physical, chart, labs and discussed the procedure including the risks, benefits and alternatives for the proposed anesthesia with the patient or authorized representative who has indicated his/her understanding and acceptance.   Dental advisory given  Plan Discussed with: CRNA and Surgeon  Anesthesia Plan Comments:         Anesthesia Quick Evaluation

## 2018-06-27 NOTE — Anesthesia Postprocedure Evaluation (Signed)
Anesthesia Post Note  Patient: Kerry Lawson  Procedure(s) Performed: DILATATION & CURETTAGE/HYSTEROSCOPY WITH MYOSURE (N/A Vagina )     Patient location during evaluation: PACU Anesthesia Type: General Level of consciousness: sedated Pain management: pain level controlled Vital Signs Assessment: post-procedure vital signs reviewed and stable Respiratory status: spontaneous breathing Cardiovascular status: stable Postop Assessment: no apparent nausea or vomiting Anesthetic complications: no    Last Vitals:  Vitals:   06/27/18 1315 06/27/18 1330  BP: 117/84 125/80  Pulse: 83 73  Resp: 12 17  Temp:    SpO2: 100% 100%    Last Pain:  Vitals:   06/27/18 1330  TempSrc:   PainSc: 1    Pain Goal: Patients Stated Pain Goal: 5 (06/27/18 1330)               Huston Foley

## 2018-06-27 NOTE — Discharge Instructions (Signed)
DISCHARGE INSTRUCTIONS: D&C / Hysteroscopy The following instructions have been prepared to help you care for yourself upon your return home.  May Remove Scop patch on or before Friday, June 30, 2018.  May take Ibuprofen after 6:55 pm today, June 27, 2018.  May take stool softner while taking narcotic pain medication to prevent constipation.  Drink plenty of water.   Personal hygiene:  Use sanitary pads for vaginal drainage, not tampons.  Shower the day after your procedure.  NO tub baths, pools or Jacuzzis for 2-3 weeks.  Wipe front to back after using the bathroom.  Activity and limitations:  Do NOT drive or operate any equipment for 24 hours. The effects of anesthesia are still present and drowsiness may result.  Do NOT rest in bed all day.  Walking is encouraged.  Walk up and down stairs slowly.  You may resume your normal activity in one to two days or as indicated by your physician.  Sexual activity: NO intercourse for at least 2 weeks after the procedure, or as indicated by your physician.  Diet: Eat a light meal as desired this evening. You may resume your usual diet tomorrow.  Return to work: You may resume your work activities in one to two days or as indicated by your doctor.  What to expect after your surgery: Expect to have vaginal bleeding/discharge for 2-3 days and spotting for up to 10 days. It is not unusual to have soreness for up to 1-2 weeks. You may have a slight burning sensation when you urinate for the first day. Mild cramps may continue for a couple of days. You may have a regular period in 2-6 weeks.  Call your doctor for any of the following:  Excessive vaginal bleeding, saturating and changing one pad every hour.  Inability to urinate 6 hours after discharge from hospital.  Pain not relieved by pain medication.  Fever of 100.4 F or greater.  Unusual vaginal discharge or odor.  Post Anesthesia Home Care  Instructions  Activity: Get plenty of rest for the remainder of the day. A responsible individual must stay with you for 24 hours following the procedure.  For the next 24 hours, DO NOT: -Drive a car -Paediatric nurse -Drink alcoholic beverages -Take any medication unless instructed by your physician -Make any legal decisions or sign important papers.  Meals: Start with liquid foods such as gelatin or soup. Progress to regular foods as tolerated. Avoid greasy, spicy, heavy foods. If nausea and/or vomiting occur, drink only clear liquids until the nausea and/or vomiting subsides. Call your physician if vomiting continues.  Special Instructions/Symptoms: Your throat may feel dry or sore from the anesthesia or the breathing tube placed in your throat during surgery. If this causes discomfort, gargle with warm salt water. The discomfort should disappear within 24 hours.  If you had a scopolamine patch placed behind your ear for the management of post- operative nausea and/or vomiting:  1. The medication in the patch is effective for 72 hours, after which it should be removed.  Wrap patch in a tissue and discard in the trash. Wash hands thoroughly with soap and water. 2. You may remove the patch earlier than 72 hours if you experience unpleasant side effects which may include dry mouth, dizziness or visual disturbances. 3. Avoid touching the patch. Wash your hands with soap and water after contact with the patch.

## 2018-06-27 NOTE — Transfer of Care (Signed)
Immediate Anesthesia Transfer of Care Note  Patient: Kerry Lawson  Procedure(s) Performed: DILATATION & CURETTAGE/HYSTEROSCOPY WITH MYOSURE (N/A Vagina )  Patient Location: PACU  Anesthesia Type:General  Level of Consciousness: awake, alert  and oriented  Airway & Oxygen Therapy: Patient Spontanous Breathing and Patient connected to nasal cannula oxygen  Post-op Assessment: Report given to RN, Post -op Vital signs reviewed and stable and Patient moving all extremities X 4  Post vital signs: Reviewed and stable  Last Vitals:  Vitals Value Taken Time  BP 132/79 06/27/2018  1:04 PM  Temp    Pulse 82 06/27/2018  1:09 PM  Resp 12 06/27/2018  1:09 PM  SpO2 100 % 06/27/2018  1:09 PM  Vitals shown include unvalidated device data.  Last Pain:  Vitals:   06/27/18 1024  TempSrc: Oral      Patients Stated Pain Goal: 5 (85/02/77 4128)  Complications: No apparent anesthesia complications

## 2018-06-27 NOTE — Op Note (Signed)
NAME: RAMEEN, GOHLKE MEDICAL RECORD [id number] ACCOUNT [id number] DATE OF BIRTH:1971/06/17 FACILITY: [facility] LOCATION: [facility] Wynetta Emery, MD  OPERATIVE REPORT  DATE OF PROCEDURE:  06/27/2018  PREOPERATIVE DIAGNOSES: 1.  Menorrhagia. 2.  Suspected endometrial polyp.  POSTOPERATIVE DIAGNOSES: 1.  Menorrhagia. 2.  Suspected endometrial polyp with large endometrial polyp observed.  PROCEDURE PERFORMED: 1.  Hysteroscopy. 2.  Resection of endometrial polyp with MyoSure resectoscope. 3.  Uterine curettage.  SURGEON:  Orvan Falconer, MD  ANESTHESIA:  General with local.  ANTIBIOTICS:  Ancef 2 g.  DRAINS:  Red rubber catheter bladder.  SPECIMENS:  Endometrial polyp and uterine curetting sent to pathology.  COMPLICATIONS:  None.  ESTIMATED BLOOD LOSS:  50 mL.  DESCRIPTION OF PROCEDURE:  The patient was taken to the operating room where she was placed in the dorsal lithotomy position.  A general anesthetic was administered without difficulty.  She was prepped and draped in the usual fashion for this procedure.   A sterile speculum was placed in the vagina.  Twenty mL of 1% lidocaine was used for paracervical block.  A single-tooth tenaculum was applied to the anterior cervical lip.  The cervix was then serially dilated to a 25 [place].  The hysteroscope was  advanced through the endocervical canal, which appeared to be normal on entering the uterine cavity.  The patient appeared to have a 2-3 cm endometrial polyp with the base on the left midportion of the body of the uterus.  At this point, the MyoSure  device was set up and used to resect the entire polyp down to its base.  The patient then had a sharp curettage performed.  Both ostia were visualized.  There was no evidence of any other abnormalities.  There were no submucous fibroids.  The patient was  then awoken and taken to recovery room in stable condition.  She will be discharged home.  She will follow up in  the office in 4 weeks.  LN/NUANCE  D:06/27/2018 T:06/27/2018 JOB:[id number]

## 2018-06-28 ENCOUNTER — Encounter (HOSPITAL_COMMUNITY): Payer: Self-pay | Admitting: Obstetrics and Gynecology

## 2019-05-04 ENCOUNTER — Other Ambulatory Visit: Payer: Self-pay | Admitting: Family Medicine

## 2019-05-04 DIAGNOSIS — Z1231 Encounter for screening mammogram for malignant neoplasm of breast: Secondary | ICD-10-CM

## 2019-06-18 ENCOUNTER — Other Ambulatory Visit: Payer: Self-pay

## 2019-06-18 ENCOUNTER — Ambulatory Visit
Admission: RE | Admit: 2019-06-18 | Discharge: 2019-06-18 | Disposition: A | Discharge status: Home / Self Care | Payer: BC Managed Care – PPO | Source: Ambulatory Visit | Attending: Family Medicine | Admitting: Family Medicine

## 2019-06-18 DIAGNOSIS — Z1231 Encounter for screening mammogram for malignant neoplasm of breast: Secondary | ICD-10-CM

## 2019-09-21 LAB — HM PAP SMEAR

## 2019-12-20 ENCOUNTER — Encounter: Payer: Self-pay | Admitting: Gastroenterology

## 2019-12-20 ENCOUNTER — Ambulatory Visit (INDEPENDENT_AMBULATORY_CARE_PROVIDER_SITE_OTHER): Payer: BC Managed Care – PPO | Admitting: Family Medicine

## 2019-12-20 ENCOUNTER — Encounter: Payer: Self-pay | Admitting: General Practice

## 2019-12-20 ENCOUNTER — Other Ambulatory Visit: Payer: Self-pay

## 2019-12-20 ENCOUNTER — Encounter: Payer: Self-pay | Admitting: Family Medicine

## 2019-12-20 VITALS — BP 118/80 | HR 108 | Temp 97.8°F | Resp 16 | Ht 60.0 in | Wt 171.0 lb

## 2019-12-20 DIAGNOSIS — Z1211 Encounter for screening for malignant neoplasm of colon: Secondary | ICD-10-CM | POA: Diagnosis not present

## 2019-12-20 DIAGNOSIS — Z0000 Encounter for general adult medical examination without abnormal findings: Secondary | ICD-10-CM | POA: Diagnosis not present

## 2019-12-20 DIAGNOSIS — E559 Vitamin D deficiency, unspecified: Secondary | ICD-10-CM

## 2019-12-20 DIAGNOSIS — E669 Obesity, unspecified: Secondary | ICD-10-CM

## 2019-12-20 LAB — HEPATIC FUNCTION PANEL
ALT: 10 U/L (ref 0–35)
AST: 17 U/L (ref 0–37)
Albumin: 4.2 g/dL (ref 3.5–5.2)
Alkaline Phosphatase: 62 U/L (ref 39–117)
Bilirubin, Direct: 0.1 mg/dL (ref 0.0–0.3)
Total Bilirubin: 0.3 mg/dL (ref 0.2–1.2)
Total Protein: 6.7 g/dL (ref 6.0–8.3)

## 2019-12-20 LAB — CBC WITH DIFFERENTIAL/PLATELET
Basophils Absolute: 0.1 10*3/uL (ref 0.0–0.1)
Basophils Relative: 1.4 % (ref 0.0–3.0)
Eosinophils Absolute: 0.3 10*3/uL (ref 0.0–0.7)
Eosinophils Relative: 6.1 % — ABNORMAL HIGH (ref 0.0–5.0)
HCT: 37.2 % (ref 36.0–46.0)
Hemoglobin: 12.4 g/dL (ref 12.0–15.0)
Lymphocytes Relative: 20.8 % (ref 12.0–46.0)
Lymphs Abs: 1.1 10*3/uL (ref 0.7–4.0)
MCHC: 33.4 g/dL (ref 30.0–36.0)
MCV: 87.8 fl (ref 78.0–100.0)
Monocytes Absolute: 0.3 10*3/uL (ref 0.1–1.0)
Monocytes Relative: 6.1 % (ref 3.0–12.0)
Neutro Abs: 3.3 10*3/uL (ref 1.4–7.7)
Neutrophils Relative %: 65.6 % (ref 43.0–77.0)
Platelets: 303 10*3/uL (ref 150.0–400.0)
RBC: 4.23 Mil/uL (ref 3.87–5.11)
RDW: 14.7 % (ref 11.5–15.5)
WBC: 5.1 10*3/uL (ref 4.0–10.5)

## 2019-12-20 LAB — VITAMIN D 25 HYDROXY (VIT D DEFICIENCY, FRACTURES): VITD: 55.12 ng/mL (ref 30.00–100.00)

## 2019-12-20 LAB — BASIC METABOLIC PANEL
BUN: 12 mg/dL (ref 6–23)
CO2: 26 mEq/L (ref 19–32)
Calcium: 8.8 mg/dL (ref 8.4–10.5)
Chloride: 104 mEq/L (ref 96–112)
Creatinine, Ser: 0.86 mg/dL (ref 0.40–1.20)
GFR: 84.26 mL/min (ref >60.00)
Glucose, Bld: 83 mg/dL (ref 70–99)
Potassium: 3.9 mEq/L (ref 3.5–5.1)
Sodium: 138 mEq/L (ref 135–145)

## 2019-12-20 LAB — LIPID PANEL
Cholesterol: 178 mg/dL (ref 0–200)
HDL: 75.8 mg/dL (ref >39.00)
LDL Cholesterol: 92 mg/dL (ref 0–99)
NonHDL: 102.67
Total CHOL/HDL Ratio: 2
Triglycerides: 51 mg/dL (ref 0.0–149.0)
VLDL: 10.2 mg/dL (ref 0.0–40.0)

## 2019-12-20 LAB — TSH: TSH: 1.36 u[IU]/mL (ref 0.35–4.50)

## 2019-12-20 NOTE — Progress Notes (Signed)
   Subjective:    Patient ID: Kerry Lawson, female    DOB: 18-Dec-1970, 49 y.o.   MRN: [id number]  HPI CPE- UTD pn pap, mammo.  Due for colonoscopy.  UTD on Tdap.  Pt has gained 5 lbs since last visit.  BMI 33.4  Pt is attempting to lose weight by changing diet.  Plans to incorporate exercise.   Review of Systems Patient reports no vision/ hearing changes, adenopathy,fever, persistant/recurrent hoarseness , swallowing issues, chest pain, palpitations, edema, persistant/recurrent cough, hemoptysis, dyspnea (rest/exertional/paroxysmal nocturnal), gastrointestinal bleeding (melena, rectal bleeding), abdominal pain, significant heartburn, bowel changes, GU symptoms (dysuria, hematuria, incontinence), Gyn symptoms (abnormal  bleeding, pain),  syncope, focal weakness, memory loss, numbness & tingling, skin/hair/nail changes, abnormal bruising or bleeding, anxiety, or depression.   This visit occurred during the SARS-CoV-2 public health emergency.  Safety protocols were in place, including screening questions prior to the visit, additional usage of staff PPE, and extensive cleaning of exam room while observing appropriate contact time as indicated for disinfecting solutions.       Objective:   Physical Exam General Appearance:    Alert, cooperative, no distress, appears stated age  Head:    Normocephalic, without obvious abnormality, atraumatic  Eyes:    PERRL, conjunctiva/corneas clear, EOM's intact, fundi    benign, both eyes  Ears:    Normal TM's and external ear canals, both ears  Nose:   Deferred due to COVID  Throat:   Neck:   Supple, symmetrical, trachea midline, no adenopathy;    Thyroid: no enlargement/tenderness/nodules  Back:     Symmetric, no curvature, ROM normal, no CVA tenderness  Lungs:     Clear to auscultation bilaterally, respirations unlabored  Chest Wall:    No tenderness or deformity   Heart:    Regular rate and rhythm, S1 and S2 normal, no murmur, rub   or gallop  Breast  Exam:    Deferred to GYN  Abdomen:     Soft, non-tender, bowel sounds active all four quadrants,    no masses, no organomegaly  Genitalia:    Deferred to GYN  Rectal:    Extremities:   Extremities normal, atraumatic, no cyanosis or edema  Pulses:   2+ and symmetric all extremities  Skin:   Skin color, texture, turgor normal, no rashes or lesions  Lymph nodes:   Cervical, supraclavicular, and axillary nodes normal  Neurologic:   CNII-XII intact, normal strength, sensation and reflexes    throughout          Assessment & Plan:

## 2019-12-20 NOTE — Patient Instructions (Signed)
Follow up in 1 year or as needed We'll notify you of your lab results and make any changes if needed Continue to work on healthy diet and regular exercise- you can do it!! We'll call you with your GI consultation for colonoscopy Call with any questions or concerns Stay Safe!  Stay Healthy! Happy Spring!!!

## 2019-12-20 NOTE — Assessment & Plan Note (Signed)
Pt has hx of this.  Check labs and replete prn. 

## 2019-12-20 NOTE — Assessment & Plan Note (Signed)
Pt's PE WNL.  UTD on pap, mammo.  Due for colonoscopy.  Check labs.  Anticipatory guidance provided.

## 2019-12-20 NOTE — Assessment & Plan Note (Signed)
Ongoing issue for pt.  Has gained 5 lbs since last visit.  Check labs to risk stratify.  Will follow.

## 2020-01-12 ENCOUNTER — Ambulatory Visit: Payer: BC Managed Care – PPO | Attending: Internal Medicine

## 2020-01-12 DIAGNOSIS — Z23 Encounter for immunization: Secondary | ICD-10-CM

## 2020-01-12 NOTE — Progress Notes (Signed)
   Covid-19 Vaccination Clinic  Name:  Kerry Lawson    MRN: [id number] DOB: 1971/05/02  01/12/2020  Kerry Lawson was observed post Covid-19 immunization for 30 minutes based on pre-vaccination screening without incident. She was provided with Vaccine Information Sheet and instruction to access the V-Safe system.   Kerry Lawson was instructed to call 911 with any severe reactions post vaccine: Marland Kitchen Difficulty breathing  . Swelling of face and throat  . A fast heartbeat  . A bad rash all over body  . Dizziness and weakness   Immunizations Administered    Name Date Dose VIS Date Route   Pfizer COVID-19 Vaccine 01/12/2020  8:45 AM 0.3 mL 10/24/2018 Intramuscular   Manufacturer: [place]   Lot: [id number]   [place]: [id number]

## 2020-01-17 ENCOUNTER — Ambulatory Visit (AMBULATORY_SURGERY_CENTER): Payer: Self-pay | Admitting: *Deleted

## 2020-01-17 ENCOUNTER — Other Ambulatory Visit: Payer: Self-pay

## 2020-01-17 VITALS — Temp 96.8°F | Ht 60.0 in | Wt 171.6 lb

## 2020-01-17 DIAGNOSIS — Z01818 Encounter for other preprocedural examination: Secondary | ICD-10-CM

## 2020-01-17 DIAGNOSIS — Z1211 Encounter for screening for malignant neoplasm of colon: Secondary | ICD-10-CM

## 2020-01-17 NOTE — Progress Notes (Signed)

## 2020-01-31 ENCOUNTER — Encounter: Payer: BC Managed Care – PPO | Admitting: Gastroenterology

## 2020-02-04 ENCOUNTER — Ambulatory Visit: Payer: BC Managed Care – PPO | Attending: Internal Medicine

## 2020-02-04 DIAGNOSIS — Z23 Encounter for immunization: Secondary | ICD-10-CM

## 2020-02-04 NOTE — Progress Notes (Signed)
   Covid-19 Vaccination Clinic  Name:  TAYLORANNE LEKAS    MRN: [id number] DOB: [birth year]  02/04/2020  Ms. Brian was observed post Covid-19 immunization for 15 minutes without incident. She was provided with Vaccine Information Sheet and instruction to access the V-Safe system.   Ms. Kopinski was instructed to call 911 with any severe reactions post vaccine: Marland Kitchen Difficulty breathing  . Swelling of face and throat  . A fast heartbeat  . A bad rash all over body  . Dizziness and weakness   Immunizations Administered    Name Date Dose VIS Date Route   Pfizer COVID-19 Vaccine 02/04/2020  8:31 AM 0.3 mL 10/24/2018 Intramuscular   Manufacturer: [employer], [employer]   Lot: [id number]   [place]: [address]-0

## 2020-02-12 ENCOUNTER — Other Ambulatory Visit: Payer: Self-pay | Admitting: Gastroenterology

## 2020-02-12 ENCOUNTER — Ambulatory Visit (INDEPENDENT_AMBULATORY_CARE_PROVIDER_SITE_OTHER): Payer: BC Managed Care – PPO

## 2020-02-12 DIAGNOSIS — Z1159 Encounter for screening for other viral diseases: Secondary | ICD-10-CM

## 2020-02-12 LAB — SARS CORONAVIRUS 2 (TAT 6-24 HRS): SARS Coronavirus 2: NEGATIVE

## 2020-02-13 ENCOUNTER — Encounter: Payer: Self-pay | Admitting: Certified Registered Nurse Anesthetist

## 2020-02-14 ENCOUNTER — Ambulatory Visit (AMBULATORY_SURGERY_CENTER): Payer: BC Managed Care – PPO | Admitting: Gastroenterology

## 2020-02-14 ENCOUNTER — Encounter: Payer: Self-pay | Admitting: Gastroenterology

## 2020-02-14 ENCOUNTER — Other Ambulatory Visit: Payer: Self-pay

## 2020-02-14 VITALS — BP 117/68 | HR 57 | Temp 97.8°F | Resp 15 | Ht 60.0 in | Wt 171.0 lb

## 2020-02-14 DIAGNOSIS — Z1211 Encounter for screening for malignant neoplasm of colon: Secondary | ICD-10-CM

## 2020-02-14 MED ORDER — SODIUM CHLORIDE 0.9 % IV SOLN: 500.0000 mL | Freq: Once | INTRAVENOUS | Status: DC

## 2020-02-14 NOTE — Op Note (Signed)
[facility] Patient Name: Kerry Lawson Procedure Date: 02/14/2020 7:22 AM MRN: [id number] Endoscopist: Thornton Park MD, MD Age: 49 Referring MD:  Date of Birth: [birth year] Gender: Female Account #: [id number] Procedure:                Colonoscopy Indications:              Screening for colorectal malignant neoplasm, This                            is the patient's first colonoscopy                           No known family history of colon cancer or polyps                           No baseline GI symptoms Medicines:                Monitored Anesthesia Care Procedure:                Pre-Anesthesia Assessment:                           - Prior to the procedure, a History and Physical                            was performed, and patient medications and                            allergies were reviewed. The patient's tolerance of                            previous anesthesia was also reviewed. The risks                            and benefits of the procedure and the sedation                            options and risks were discussed with the patient.                            All questions were answered, and informed consent                            was obtained. Prior Anticoagulants: The patient has                            taken no previous anticoagulant or antiplatelet                            agents. ASA Grade Assessment: I - A normal, healthy                            patient. After reviewing the risks and benefits,  the patient was deemed in satisfactory condition to                            undergo the procedure.                           After obtaining informed consent, the colonoscope                            was passed under direct vision. Throughout the                            procedure, the patient's blood pressure, pulse, and                            oxygen saturations were monitored continuously. The                             Colonoscope was introduced through the anus and                            advanced to the the cecum, identified by                            appendiceal orifice and ileocecal valve. A second                            forward view of the right colon was performed. The                            colonoscopy was performed without difficulty. The                            patient tolerated the procedure well. The quality                            of the bowel preparation was good. The ileocecal                            valve, appendiceal orifice, and rectum were                            photographed. Scope In: 8:17:06 AM Scope Out: 8:28:39 AM Scope Withdrawal Time: 0 hours 8 minutes 57 seconds  Total Procedure Duration: 0 hours 11 minutes 33 seconds  Findings:                 The perianal and digital rectal examinations were                            normal.                           The entire examined colon appeared normal on direct  and retroflexion views. Complications:            No immediate complications. Estimated Blood Loss:     Estimated blood loss: none. Impression:               - The entire examined colon is normal on direct and                            retroflexion views.                           - No specimens collected. Recommendation:           - Patient has a contact number available for                            emergencies. The signs and symptoms of potential                            delayed complications were discussed with the                            patient. Return to normal activities tomorrow.                            Written discharge instructions were provided to the                            patient.                           - Resume previous diet.                           - Continue present medications.                           - Repeat colonoscopy in 10 years for surveillance.                            - Emerging evidence supports eating a diet of                            fruits, vegetables, grains, calcium, and yogurt                            while reducing red meat and alcohol may reduce the                            risk of colon cancer.                           - Thank you for allowing me to be involved in your                            colon cancer prevention. Thornton Park MD, MD 02/14/2020 8:34:43 AM This report has  been signed electronically.

## 2020-02-14 NOTE — Progress Notes (Signed)
Pt's states no medical or surgical changes since previsit or office visit.  Temp- June Vitals- Courtney 

## 2020-02-14 NOTE — Progress Notes (Signed)
Report given to PACU, vss 

## 2020-02-14 NOTE — Patient Instructions (Signed)
YOU HAD AN ENDOSCOPIC PROCEDURE TODAY AT THE [facility]:   Refer to the procedure report that was given to you for any specific questions about what was found during the examination.  If the procedure report does not answer your questions, please call your gastroenterologist to clarify.  If you requested that your care partner not be given the details of your procedure findings, then the procedure report has been included in a sealed envelope for you to review at your convenience later.  YOU SHOULD EXPECT: Some feelings of bloating in the abdomen. Passage of more gas than usual.  Walking can help get rid of the air that was put into your GI tract during the procedure and reduce the bloating. If you had a lower endoscopy (such as a colonoscopy or flexible sigmoidoscopy) you may notice spotting of blood in your stool or on the toilet paper. If you underwent a bowel prep for your procedure, you may not have a normal bowel movement for a few days.  Please Note:  You might notice some irritation and congestion in your nose or some drainage.  This is from the oxygen used during your procedure.  There is no need for concern and it should clear up in a day or so.  SYMPTOMS TO REPORT IMMEDIATELY:   Following lower endoscopy (colonoscopy or flexible sigmoidoscopy):  Excessive amounts of blood in the stool  Significant tenderness or worsening of abdominal pains  Swelling of the abdomen that is new, acute  Fever of 100F or higher  For urgent or emergent issues, a gastroenterologist can be reached at any hour by calling [phone]. Do not use MyChart messaging for urgent concerns.    DIET:  We do recommend a small meal at first, but then you may proceed to your regular diet.  Drink plenty of fluids but you should avoid alcoholic beverages for 24 hours.  ACTIVITY:  You should plan to take it easy for the rest of today and you should NOT DRIVE or use heavy machinery until tomorrow (because  of the sedation medicines used during the test).    FOLLOW UP: Our staff will call the number listed on your records 48-72 hours following your procedure to check on you and address any questions or concerns that you may have regarding the information given to you following your procedure. If we do not reach you, we will leave a message.  We will attempt to reach you two times.  During this call, we will ask if you have developed any symptoms of COVID 19. If you develop any symptoms (ie: fever, flu-like symptoms, shortness of breath, cough etc.) before then, please call [phone].  If you test positive for Covid 19 in the 2 weeks post procedure, please call and report this information to us.    If any biopsies were taken you will be contacted by phone or by letter within the next 1-3 weeks.  Please call us at [phone] if you have not heard about the biopsies in 3 weeks.    SIGNATURES/CONFIDENTIALITY: You and/or your care partner have signed paperwork which will be entered into your electronic medical record.  These signatures attest to the fact that that the information above on your After Visit Summary has been reviewed and is understood.  Full responsibility of the confidentiality of this discharge information lies with you and/or your care-partner. 

## 2020-02-18 ENCOUNTER — Telehealth: Payer: Self-pay

## 2020-02-18 NOTE — Telephone Encounter (Signed)
1st follow up call made.  NALM 

## 2020-02-18 NOTE — Telephone Encounter (Signed)
  Follow up Call-  Call back number 02/14/2020  Post procedure Call Back phone  # [phone]  Permission to leave phone message Yes  Some recent data might be hidden     Patient questions:  Do you have a fever, pain , or abdominal swelling? No. Pain Score  0 *  Have you tolerated food without any problems? Yes.    Have you been able to return to your normal activities? Yes.    Do you have any questions about your discharge instructions: Diet   No. Medications  No. Follow up visit  No.  Do you have questions or concerns about your Care? No.  Actions: * If pain score is 4 or above: No action needed, pain <4.  1. Have you developed a fever since your procedure? NO  2.   Have you had an respiratory symptoms (SOB or cough) since your procedure? NO  3.   Have you tested positive for COVID 19 since your procedure NO  4.   Have you had any family members/close contacts diagnosed with the COVID 19 since your procedure?  NO   If yes to any of these questions please route to Joylene John, RN and Erenest Rasher, RN

## 2020-05-07 ENCOUNTER — Other Ambulatory Visit: Payer: Self-pay | Admitting: Family Medicine

## 2020-05-07 DIAGNOSIS — Z1231 Encounter for screening mammogram for malignant neoplasm of breast: Secondary | ICD-10-CM

## 2020-06-18 ENCOUNTER — Ambulatory Visit
Admission: RE | Admit: 2020-06-18 | Discharge: 2020-06-18 | Disposition: A | Discharge status: Home / Self Care | Payer: BC Managed Care – PPO | Source: Ambulatory Visit | Attending: Family Medicine | Admitting: Family Medicine

## 2020-06-18 ENCOUNTER — Other Ambulatory Visit: Payer: Self-pay

## 2020-06-18 DIAGNOSIS — Z1231 Encounter for screening mammogram for malignant neoplasm of breast: Secondary | ICD-10-CM

## 2020-08-25 ENCOUNTER — Ambulatory Visit: Payer: BC Managed Care – PPO | Attending: Internal Medicine

## 2020-08-25 ENCOUNTER — Other Ambulatory Visit: Payer: Self-pay

## 2020-08-25 DIAGNOSIS — Z23 Encounter for immunization: Secondary | ICD-10-CM

## 2020-08-25 NOTE — Progress Notes (Signed)
   Covid-19 Vaccination Clinic  Name:  Gayatri Teasdale    MRN: [id number] DOB: 08-05-1971  08/25/2020  Ms. Alkhatib was observed post Covid-19 immunization for 15 minutes without incident. She was provided with Vaccine Information Sheet and instruction to access the V-Safe system.   Ms. Graefe was instructed to call 911 with any severe reactions post vaccine: Marland Kitchen Difficulty breathing  . Swelling of face and throat  . A fast heartbeat  . A bad rash all over body  . Dizziness and weakness   Immunizations Administered    Name Date Dose VIS Date Route   Pfizer COVID-19 Vaccine 08/25/2020  2:24 PM 0.3 mL 06/18/2020 Intramuscular   Manufacturer: [employer], [employer]   Lot: [id number]   [place]: [address]-2

## 2020-09-30 DIAGNOSIS — D259 Leiomyoma of uterus, unspecified: Secondary | ICD-10-CM | POA: Insufficient documentation

## 2020-10-09 ENCOUNTER — Other Ambulatory Visit: Payer: Self-pay

## 2020-10-09 DIAGNOSIS — Z20822 Contact with and (suspected) exposure to covid-19: Secondary | ICD-10-CM

## 2020-10-10 LAB — NOVEL CORONAVIRUS, NAA: SARS-CoV-2, NAA: NOT DETECTED

## 2020-10-10 LAB — SARS-COV-2, NAA 2 DAY TAT

## 2020-12-22 ENCOUNTER — Encounter: Payer: Self-pay | Admitting: Family Medicine

## 2020-12-22 ENCOUNTER — Ambulatory Visit (INDEPENDENT_AMBULATORY_CARE_PROVIDER_SITE_OTHER): Payer: BC Managed Care – PPO | Admitting: Family Medicine

## 2020-12-22 ENCOUNTER — Other Ambulatory Visit: Payer: Self-pay

## 2020-12-22 VITALS — BP 118/80 | HR 67 | Temp 98.6°F | Resp 18 | Ht 60.0 in | Wt 173.8 lb

## 2020-12-22 DIAGNOSIS — E559 Vitamin D deficiency, unspecified: Secondary | ICD-10-CM | POA: Diagnosis not present

## 2020-12-22 DIAGNOSIS — Z114 Encounter for screening for human immunodeficiency virus [HIV]: Secondary | ICD-10-CM

## 2020-12-22 DIAGNOSIS — Z1159 Encounter for screening for other viral diseases: Secondary | ICD-10-CM | POA: Diagnosis not present

## 2020-12-22 DIAGNOSIS — Z0000 Encounter for general adult medical examination without abnormal findings: Secondary | ICD-10-CM | POA: Diagnosis not present

## 2020-12-22 DIAGNOSIS — E669 Obesity, unspecified: Secondary | ICD-10-CM | POA: Diagnosis not present

## 2020-12-22 DIAGNOSIS — N951 Menopausal and female climacteric states: Secondary | ICD-10-CM

## 2020-12-22 LAB — BASIC METABOLIC PANEL
BUN: 11 mg/dL (ref 6–23)
CO2: 26 mEq/L (ref 19–32)
Calcium: 8.9 mg/dL (ref 8.4–10.5)
Chloride: 104 mEq/L (ref 96–112)
Creatinine, Ser: 0.83 mg/dL (ref 0.40–1.20)
GFR: 81.43 mL/min (ref >60.00)
Glucose, Bld: 71 mg/dL (ref 70–99)
Potassium: 3.5 mEq/L (ref 3.5–5.1)
Sodium: 136 mEq/L (ref 135–145)

## 2020-12-22 LAB — VITAMIN D 25 HYDROXY (VIT D DEFICIENCY, FRACTURES): VITD: 63.02 ng/mL (ref 30.00–100.00)

## 2020-12-22 LAB — CBC WITH DIFFERENTIAL/PLATELET
Basophils Absolute: 0.1 10*3/uL (ref 0.0–0.1)
Basophils Relative: 1.4 % (ref 0.0–3.0)
Eosinophils Absolute: 0.2 10*3/uL (ref 0.0–0.7)
Eosinophils Relative: 4.8 % (ref 0.0–5.0)
HCT: 28.9 % — ABNORMAL LOW (ref 36.0–46.0)
Hemoglobin: 9.2 g/dL — ABNORMAL LOW (ref 12.0–15.0)
Lymphocytes Relative: 24.9 % (ref 12.0–46.0)
Lymphs Abs: 1.3 10*3/uL (ref 0.7–4.0)
MCHC: 31.9 g/dL (ref 30.0–36.0)
MCV: 75.8 fl — ABNORMAL LOW (ref 78.0–100.0)
Monocytes Absolute: 0.3 10*3/uL (ref 0.1–1.0)
Monocytes Relative: 5.3 % (ref 3.0–12.0)
Neutro Abs: 3.2 10*3/uL (ref 1.4–7.7)
Neutrophils Relative %: 63.6 % (ref 43.0–77.0)
Platelets: 381 10*3/uL (ref 150.0–400.0)
RBC: 3.81 Mil/uL — ABNORMAL LOW (ref 3.87–5.11)
RDW: 17.5 % — ABNORMAL HIGH (ref 11.5–15.5)
WBC: 5 10*3/uL (ref 4.0–10.5)

## 2020-12-22 LAB — TSH: TSH: 1.15 u[IU]/mL (ref 0.35–4.50)

## 2020-12-22 LAB — LIPID PANEL
Cholesterol: 167 mg/dL (ref 0–200)
HDL: 78.3 mg/dL (ref >39.00)
LDL Cholesterol: 81 mg/dL (ref 0–99)
NonHDL: 88.89
Total CHOL/HDL Ratio: 2
Triglycerides: 38 mg/dL (ref 0.0–149.0)
VLDL: 7.6 mg/dL (ref 0.0–40.0)

## 2020-12-22 LAB — HEPATIC FUNCTION PANEL
ALT: 11 U/L (ref 0–35)
AST: 21 U/L (ref 0–37)
Albumin: 3.9 g/dL (ref 3.5–5.2)
Alkaline Phosphatase: 56 U/L (ref 39–117)
Bilirubin, Direct: 0.1 mg/dL (ref 0.0–0.3)
Total Bilirubin: 0.3 mg/dL (ref 0.2–1.2)
Total Protein: 7.3 g/dL (ref 6.0–8.3)

## 2020-12-22 NOTE — Progress Notes (Signed)
   Subjective:    Patient ID: Kerry Lawson, female    DOB: Dec 14, 1970, 50 y.o.   MRN: [id number]  HPI CPE- UTD on pap, mammo, colonoscopy, Tdap.  UTD on COVID  Reviewed past medical, surgical, family and social histories.   Patient Care Team    Relationship Specialty Notifications Start End  Midge Minium, MD PCP - General   08/12/10   Alden Hipp, MD Consulting Physician Obstetrics and Gynecology  01/13/15      Health Maintenance  Topic Date Due  . Hepatitis C Screening  Never done  . INFLUENZA VACCINE  03/30/2021  . MAMMOGRAM  06/18/2021  . PAP SMEAR-Modifier  09/20/2022  . TETANUS/TDAP  01/02/2024  . COLONOSCOPY (Pts 45-72yrs Insurance coverage will need to be confirmed)  02/13/2030  . COVID-19 Vaccine  Completed  . HIV Screening  Completed  . HPV VACCINES  Aged Out      Review of Systems Patient reports no vision/ hearing changes, adenopathy,fever, weight change,  persistant/recurrent hoarseness , swallowing issues, chest pain, palpitations, edema, persistant/recurrent cough, hemoptysis, dyspnea (rest/exertional/paroxysmal nocturnal), gastrointestinal bleeding (melena, rectal bleeding), abdominal pain, significant heartburn, bowel changes, GU symptoms (dysuria, hematuria, incontinence), Gyn symptoms (abnormal  bleeding, pain),  syncope, focal weakness, memory loss, numbness & tingling, skin/hair/nail changes, abnormal bruising or bleeding, anxiety, or depression.   This visit occurred during the SARS-CoV-2 public health emergency.  Safety protocols were in place, including screening questions prior to the visit, additional usage of staff PPE, and extensive cleaning of exam room while observing appropriate contact time as indicated for disinfecting solutions.       Objective:   Physical Exam General Appearance:    Alert, cooperative, no distress, appears stated age  Head:    Normocephalic, without obvious abnormality, atraumatic  Eyes:    PERRL,  conjunctiva/corneas clear, EOM's intact, fundi    benign, both eyes  Ears:    Normal TM's and external ear canals, both ears  Nose:   Deferred due to COVID  Throat:   Neck:   Supple, symmetrical, trachea midline, no adenopathy;    Thyroid: no enlargement/tenderness/nodules  Back:     Symmetric, no curvature, ROM normal, no CVA tenderness  Lungs:     Clear to auscultation bilaterally, respirations unlabored  Chest Wall:    No tenderness or deformity   Heart:    Regular rate and rhythm, S1 and S2 normal, no murmur, rub   or gallop  Breast Exam:    Deferred to GYN  Abdomen:     Soft, non-tender, bowel sounds active all four quadrants,    no masses, no organomegaly  Genitalia:    Deferred to GYN  Rectal:    Extremities:   Extremities normal, atraumatic, no cyanosis or edema  Pulses:   2+ and symmetric all extremities  Skin:   Skin color, texture, turgor normal, no rashes or lesions  Lymph nodes:   Cervical, supraclavicular, and axillary nodes normal  Neurologic:   CNII-XII intact, normal strength, sensation and reflexes    throughout          Assessment & Plan:

## 2020-12-22 NOTE — Patient Instructions (Signed)
Follow up in 1 year or as needed We'll notify you of your lab results and make any changes if needed Continue to work on healthy diet and regular exercise- you can do it! Call with any questions or concerns Happy Spring!!! 

## 2020-12-22 NOTE — Assessment & Plan Note (Signed)
Pt's PE WNL w/ exception of obesity.  UTD on pap, mammo, colonoscopy, immunizations.  Check labs.  Anticipatory guidance provided.

## 2020-12-22 NOTE — Assessment & Plan Note (Addendum)
Pt's BMI is 33.94  Encouraged healthy diet and regular exercise.  Check labs to risk stratify.  Will follow.

## 2020-12-22 NOTE — Assessment & Plan Note (Signed)
Pt has hx of this.  Check labs and replete prn. 

## 2020-12-23 ENCOUNTER — Other Ambulatory Visit: Payer: Self-pay

## 2020-12-23 LAB — HEPATITIS C ANTIBODY
Hepatitis C Ab: NONREACTIVE
SIGNAL TO CUT-OFF: 0.1 (ref <1.00)

## 2020-12-23 LAB — HIV ANTIBODY (ROUTINE TESTING W REFLEX): HIV 1&2 Ab, 4th Generation: NONREACTIVE

## 2020-12-23 MED ORDER — FERROUS SULFATE 325 (65 FE) MG PO TABS: 325.0000 mg | ORAL_TABLET | Freq: Every day | ORAL | 3 refills | Status: AC

## 2021-02-25 ENCOUNTER — Encounter: Payer: Self-pay | Admitting: *Deleted

## 2021-06-01 ENCOUNTER — Other Ambulatory Visit: Payer: Self-pay | Admitting: Family Medicine

## 2021-06-01 DIAGNOSIS — Z1231 Encounter for screening mammogram for malignant neoplasm of breast: Secondary | ICD-10-CM

## 2021-06-22 ENCOUNTER — Ambulatory Visit
Admission: RE | Admit: 2021-06-22 | Discharge: 2021-06-22 | Disposition: A | Discharge status: Home / Self Care | Payer: BC Managed Care – PPO | Source: Ambulatory Visit | Attending: Family Medicine | Admitting: Family Medicine

## 2021-06-22 ENCOUNTER — Other Ambulatory Visit: Payer: Self-pay

## 2021-06-22 DIAGNOSIS — Z1231 Encounter for screening mammogram for malignant neoplasm of breast: Secondary | ICD-10-CM

## 2021-12-24 ENCOUNTER — Other Ambulatory Visit (INDEPENDENT_AMBULATORY_CARE_PROVIDER_SITE_OTHER): Payer: BC Managed Care – PPO

## 2021-12-24 ENCOUNTER — Encounter: Payer: Self-pay | Admitting: Family Medicine

## 2021-12-24 ENCOUNTER — Ambulatory Visit (INDEPENDENT_AMBULATORY_CARE_PROVIDER_SITE_OTHER): Payer: BC Managed Care – PPO | Admitting: Family Medicine

## 2021-12-24 VITALS — BP 110/70 | HR 69 | Temp 97.7°F | Resp 16 | Ht 60.0 in | Wt 178.5 lb

## 2021-12-24 DIAGNOSIS — E559 Vitamin D deficiency, unspecified: Secondary | ICD-10-CM

## 2021-12-24 DIAGNOSIS — E669 Obesity, unspecified: Secondary | ICD-10-CM

## 2021-12-24 DIAGNOSIS — L603 Nail dystrophy: Secondary | ICD-10-CM | POA: Diagnosis not present

## 2021-12-24 DIAGNOSIS — Z0000 Encounter for general adult medical examination without abnormal findings: Secondary | ICD-10-CM | POA: Diagnosis not present

## 2021-12-24 LAB — CBC WITH DIFFERENTIAL/PLATELET
Basophils Absolute: 0.1 10*3/uL (ref 0.0–0.1)
Basophils Relative: 1.4 % (ref 0.0–3.0)
Eosinophils Absolute: 0.2 10*3/uL (ref 0.0–0.7)
Eosinophils Relative: 3.6 % (ref 0.0–5.0)
HCT: 34 % — ABNORMAL LOW (ref 36.0–46.0)
Hemoglobin: 11.3 g/dL — ABNORMAL LOW (ref 12.0–15.0)
Lymphocytes Relative: 18.6 % (ref 12.0–46.0)
Lymphs Abs: 1 10*3/uL (ref 0.7–4.0)
MCHC: 33.2 g/dL (ref 30.0–36.0)
MCV: 84.7 fl (ref 78.0–100.0)
Monocytes Absolute: 0.3 10*3/uL (ref 0.1–1.0)
Monocytes Relative: 5.6 % (ref 3.0–12.0)
Neutro Abs: 4 10*3/uL (ref 1.4–7.7)
Neutrophils Relative %: 70.8 % (ref 43.0–77.0)
Platelets: 262 10*3/uL (ref 150.0–400.0)
RBC: 4.01 Mil/uL (ref 3.87–5.11)
RDW: 16 % — ABNORMAL HIGH (ref 11.5–15.5)
WBC: 5.6 10*3/uL (ref 4.0–10.5)

## 2021-12-24 LAB — HEPATIC FUNCTION PANEL
ALT: 11 U/L (ref 0–35)
AST: 19 U/L (ref 0–37)
Albumin: 4.1 g/dL (ref 3.5–5.2)
Alkaline Phosphatase: 61 U/L (ref 39–117)
Bilirubin, Direct: 0 mg/dL (ref 0.0–0.3)
Total Bilirubin: 0.3 mg/dL (ref 0.2–1.2)
Total Protein: 6.9 g/dL (ref 6.0–8.3)

## 2021-12-24 LAB — BASIC METABOLIC PANEL
BUN: 8 mg/dL (ref 6–23)
CO2: 23 mEq/L (ref 19–32)
Calcium: 8.8 mg/dL (ref 8.4–10.5)
Chloride: 103 mEq/L (ref 96–112)
Creatinine, Ser: 0.72 mg/dL (ref 0.40–1.20)
GFR: 95.9 mL/min (ref >60.00)
Glucose, Bld: 88 mg/dL (ref 70–99)
Potassium: 3.5 mEq/L (ref 3.5–5.1)
Sodium: 137 mEq/L (ref 135–145)

## 2021-12-24 LAB — LIPID PANEL
Cholesterol: 177 mg/dL (ref 0–200)
HDL: 73.9 mg/dL (ref >39.00)
LDL Cholesterol: 88 mg/dL (ref 0–99)
NonHDL: 102.64
Total CHOL/HDL Ratio: 2
Triglycerides: 72 mg/dL (ref 0.0–149.0)
VLDL: 14.4 mg/dL (ref 0.0–40.0)

## 2021-12-24 LAB — TSH: TSH: 1.21 u[IU]/mL (ref 0.35–5.50)

## 2021-12-24 LAB — VITAMIN D 25 HYDROXY (VIT D DEFICIENCY, FRACTURES): VITD: 51.93 ng/mL (ref 30.00–100.00)

## 2021-12-24 NOTE — Assessment & Plan Note (Signed)
Pt's BMI 34.86.  Encouraged healthy diet and regular exercise.  Check labs to risk stratify.  Will follow. ?

## 2021-12-24 NOTE — Assessment & Plan Note (Signed)
Check labs and replete prn. 

## 2021-12-24 NOTE — Assessment & Plan Note (Signed)
Pt's PE WNL w/ exception of obesity and soft, splitting nails.  UTD on pap, mammo, colonoscopy, immunizations.  Check labs.  Anticipatory guidance provided.  ?

## 2021-12-24 NOTE — Progress Notes (Signed)
? ?  Subjective:  ? ? Patient ID: Kerry Lawson, female    DOB: 1971-06-26, 51 y.o.   MRN: [id number] ? ?HPI ?CPE- UTD on mammo, pap, Tdap, colonoscopy. ? ?Patient Care Team  ?  Relationship Specialty Notifications Start End  ?Midge Minium, MD PCP - General   08/12/10   ?Alden Hipp, MD Consulting Physician Obstetrics and Gynecology  01/13/15   ?  ?Health Maintenance  ?Topic Date Due  ? Zoster Vaccines- Shingrix (1 of 2) Never done  ? COVID-19 Vaccine (4 - Booster for [place] series) 04/16/2022 (Originally 10/20/2020)  ? INFLUENZA VACCINE  03/30/2022  ? MAMMOGRAM  06/22/2022  ? PAP SMEAR-Modifier  09/20/2022  ? TETANUS/TDAP  01/02/2024  ? COLONOSCOPY (Pts 45-83yr Insurance coverage will need to be confirmed)  02/13/2030  ? Hepatitis C Screening  Completed  ? HIV Screening  Completed  ? HPV VACCINES  Aged Out  ?  ? ? ?Review of Systems ?Patient reports no vision/ hearing changes, adenopathy,fever, weight change,  persistant/recurrent hoarseness , swallowing issues, chest pain, palpitations, edema, persistant/recurrent cough, hemoptysis, dyspnea (rest/exertional/paroxysmal nocturnal), gastrointestinal bleeding (melena, rectal bleeding), abdominal pain, significant heartburn, bowel changes, GU symptoms (dysuria, hematuria, incontinence), Gyn symptoms (abnormal  bleeding, pain),  syncope, focal weakness, memory loss, numbness & tingling, skin/hair changes, abnormal bruising or bleeding, anxiety, or depression.  ? ?+ split nails ?   ?Objective:  ? Physical Exam ?General Appearance:    Alert, cooperative, no distress, appears stated age  ?Head:    Normocephalic, without obvious abnormality, atraumatic  ?Eyes:    PERRL, conjunctiva/corneas clear, EOM's intact both eyes  ?Ears:    Normal TM's and external ear canals, both ears  ?Nose:   Nares normal, septum midline, mucosa normal, no drainage  ?  or sinus tenderness  ?Throat:   Lips, mucosa, and tongue normal; teeth and gums normal  ?Neck:   Supple, symmetrical,  trachea midline, no adenopathy;  ?  Thyroid: no enlargement/tenderness/nodules  ?Back:     Symmetric, no curvature, ROM normal, no CVA tenderness  ?Lungs:     Clear to auscultation bilaterally, respirations unlabored  ?Chest Wall:    No tenderness or deformity  ? Heart:    Regular rate and rhythm, S1 and S2 normal, no murmur, rub ?  or gallop  ?Breast Exam:    Deferred to GYN  ?Abdomen:     Soft, non-tender, bowel sounds active all four quadrants,  ?  no masses, no organomegaly  ?Genitalia:    Deferred to GYN  ?Rectal:    ?Extremities:   Extremities normal, atraumatic, no cyanosis or edema  ?Pulses:   2+ and symmetric all extremities  ?Skin:   Skin color, texture, turgor normal, no rashes or lesions.  Nails are soft w/ longitudinal splitting  ?Lymph nodes:   Cervical, supraclavicular, and axillary nodes normal  ?Neurologic:   CNII-XII intact, normal strength, sensation and reflexes  ?  throughout  ?  ? ? ? ?   ?Assessment & Plan:  ? ? ?

## 2021-12-24 NOTE — Patient Instructions (Addendum)
Follow up in 1 year or as needed ?Go to [facility] and get your labs done at your convenience ?We'll notify you of your lab results and make any changes if needed ?Continue to work on healthy diet and regular exercise- you can do it!!! ?Call with any questions or concerns ?Stay Safe!  Stay Healthy!!! ?Happy Spring!!! ?

## 2021-12-25 LAB — IRON,TIBC AND FERRITIN PANEL
%SAT: 9 % (calc) — ABNORMAL LOW (ref 16–45)
Ferritin: 13 ng/mL — ABNORMAL LOW (ref 16–232)
Iron: 32 ug/dL — ABNORMAL LOW (ref 45–160)
TIBC: 344 mcg/dL (calc) (ref 250–450)

## 2022-04-25 IMAGING — MG MM DIGITAL SCREENING BILAT W/ TOMO AND CAD
8 series · 9 of 24 positions shown · non-contrast
Comparison: Previous exam(s).

CLINICAL DATA: Screening.

EXAM:
DIGITAL SCREENING BILATERAL MAMMOGRAM WITH TOMOSYNTHESIS AND CAD
TECHNIQUE: Bilateral screening digital craniocaudal and mediolateral oblique
mammograms were obtained. Bilateral screening digital breast
tomosynthesis was performed. The images were evaluated with
computer-aided detection.

[L CC synth-2D]
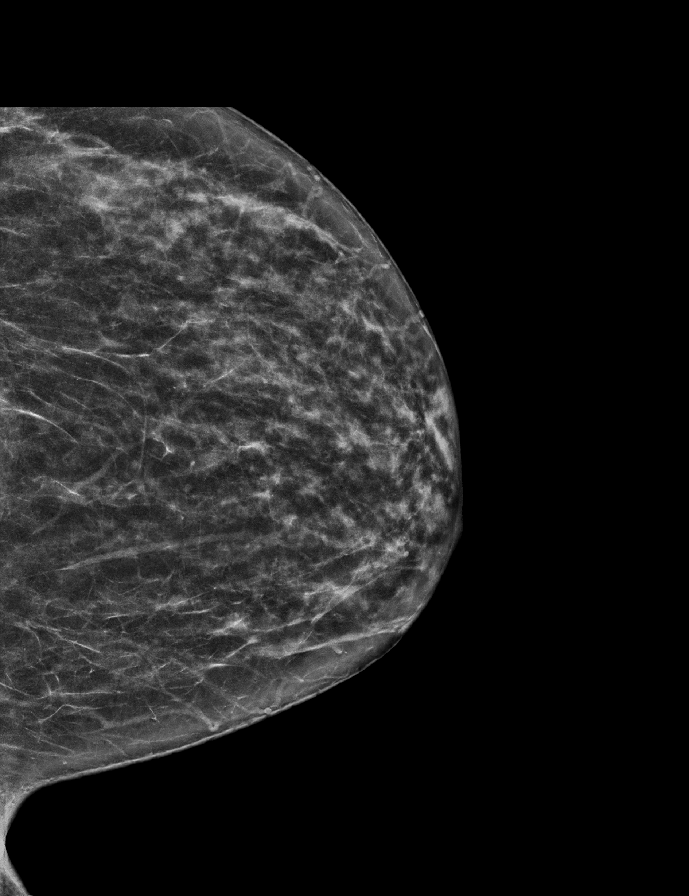

[L MLO synth-2D]
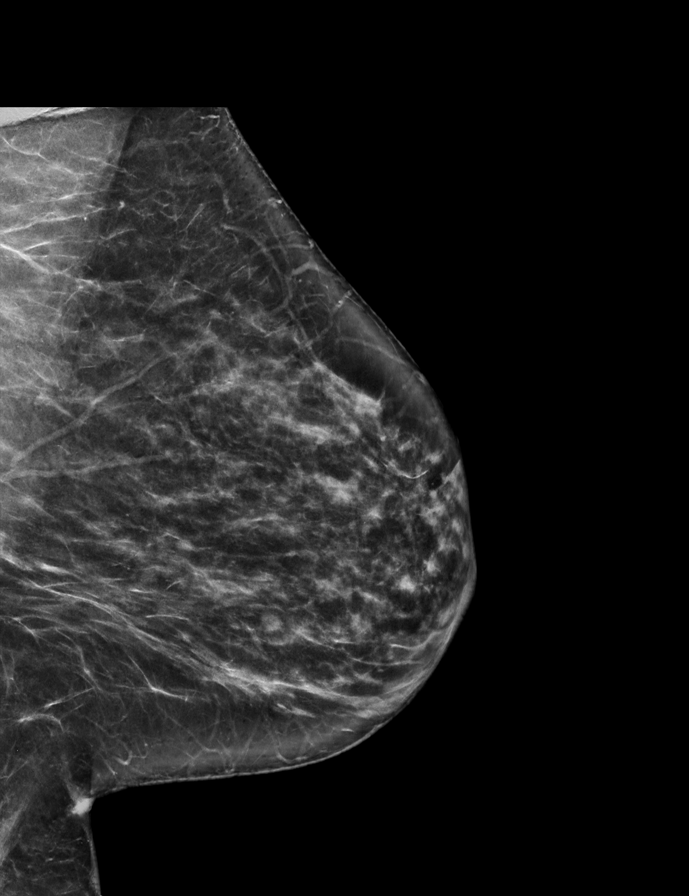

[R MLO synth-2D]
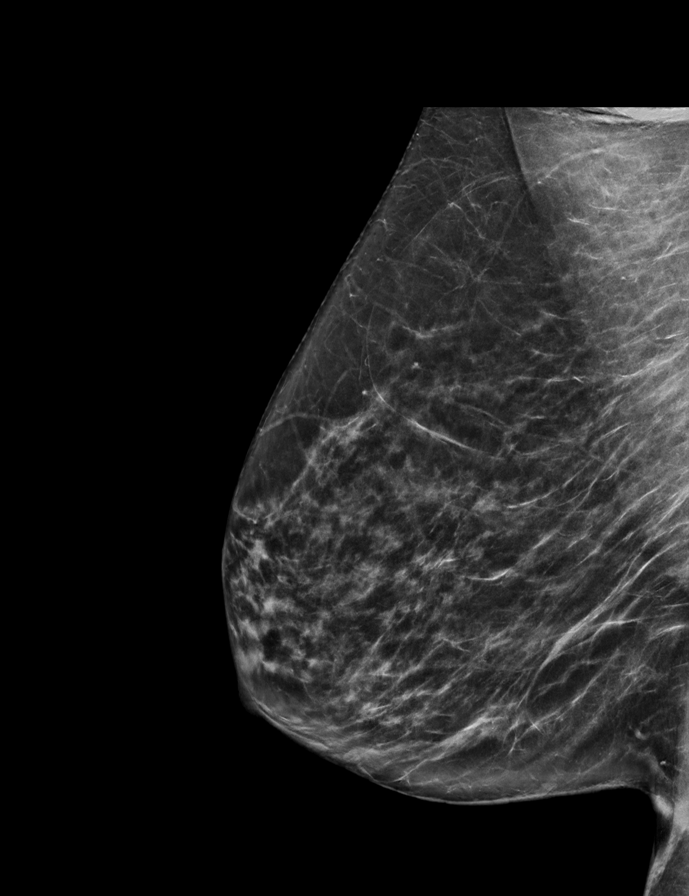

[R CC synth-2D]
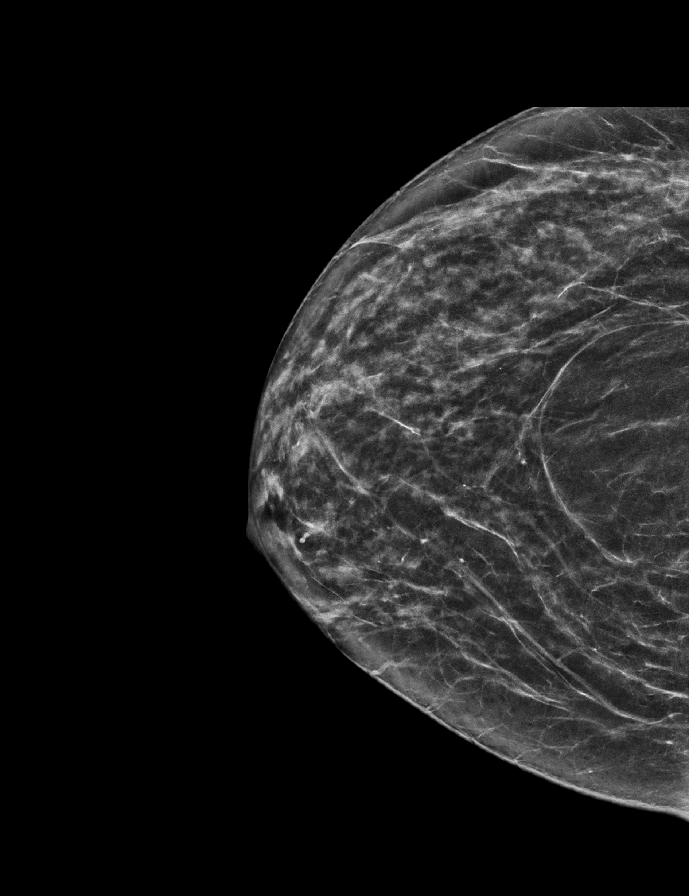

[R CC tomo · 2 of 59 frames shown]
[frame 20/59]
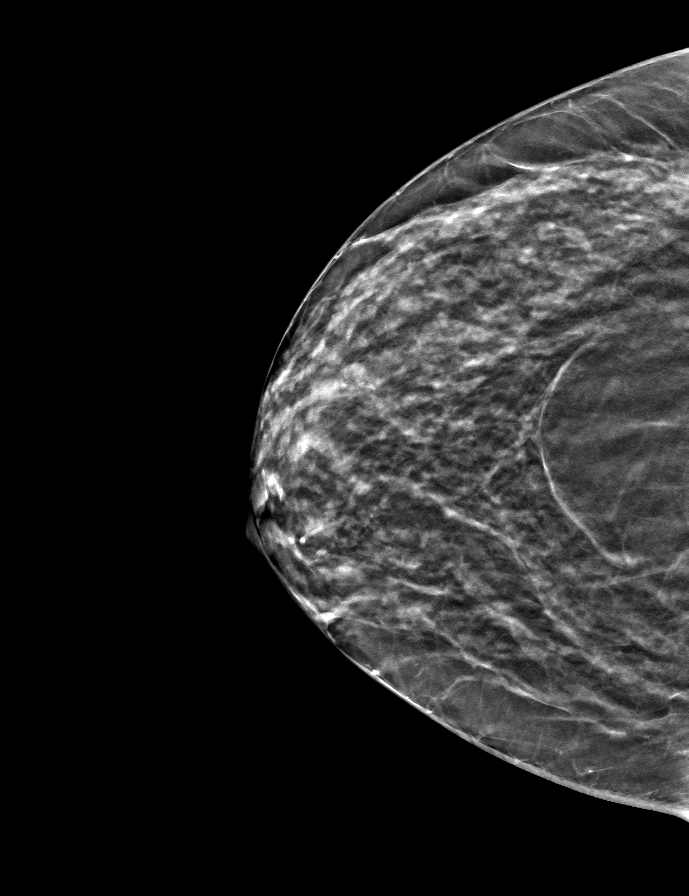
[frame 30/59]
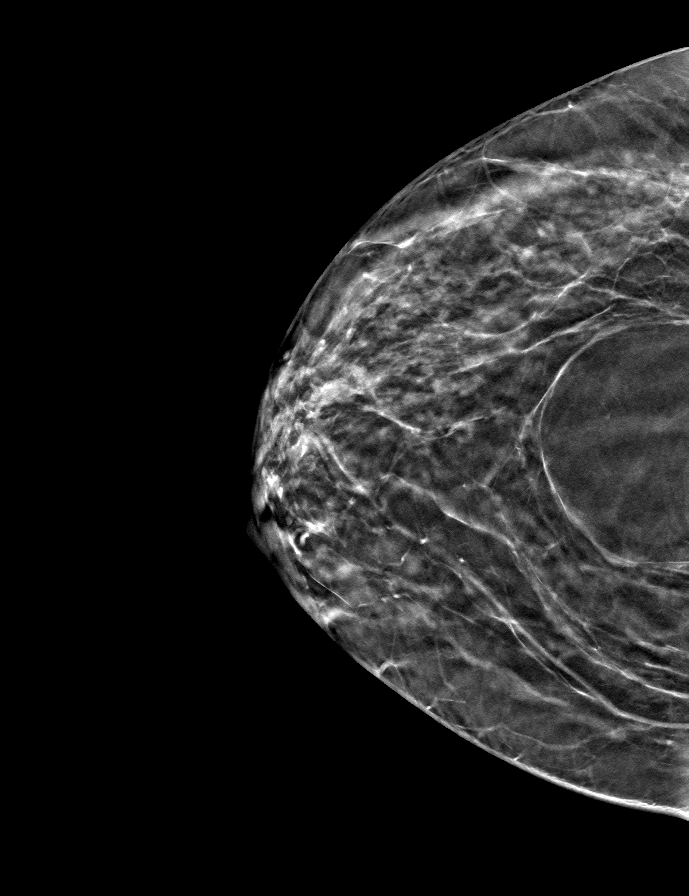

[L MLO tomo · tomo slice 35/70.0]
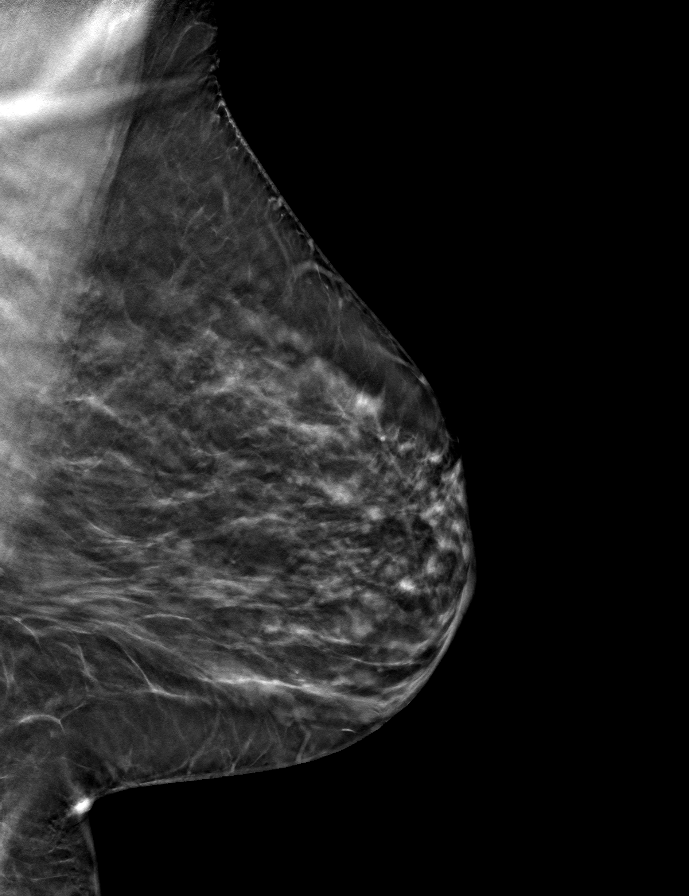

[R MLO tomo · tomo slice 35/68.0]
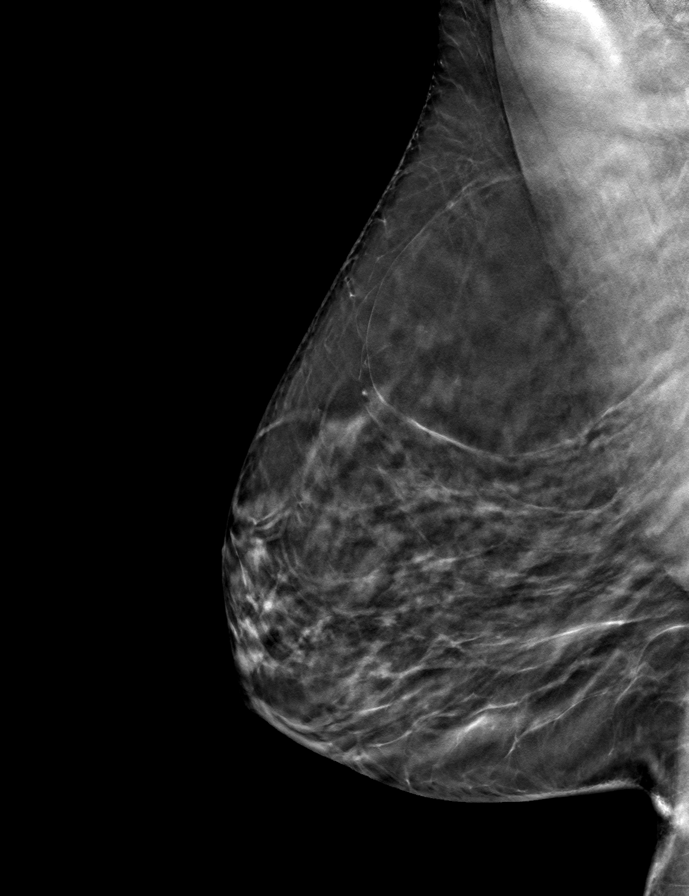

[L CC tomo · tomo slice 31/61.0]
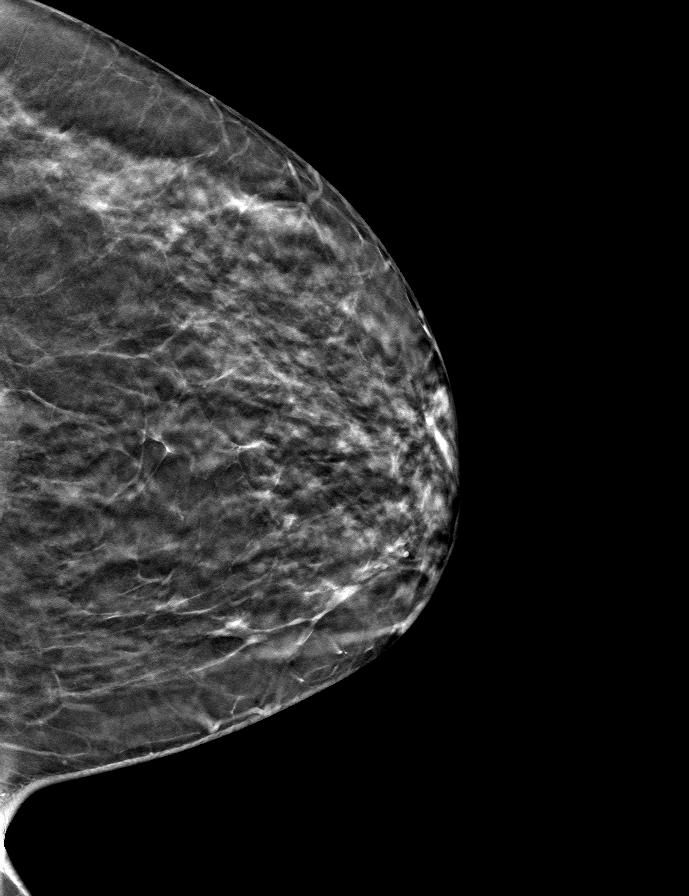

[9 of 24 positions shown; findings below may reference images not displayed]

ACR Breast Density Category c: The breast tissue is heterogeneously
dense, which may obscure small masses.
FINDINGS: There are no findings suspicious for malignancy.
IMPRESSION: No mammographic evidence of malignancy. A result letter of this
screening mammogram will be mailed directly to the patient.

RECOMMENDATION:
Screening mammogram in one year. (Code:[id number])

BI-RADS CATEGORY  1: Negative.

## 2022-05-11 ENCOUNTER — Other Ambulatory Visit: Payer: Self-pay | Admitting: Family Medicine

## 2022-05-11 DIAGNOSIS — Z1231 Encounter for screening mammogram for malignant neoplasm of breast: Secondary | ICD-10-CM

## 2022-06-24 DIAGNOSIS — Z1231 Encounter for screening mammogram for malignant neoplasm of breast: Secondary | ICD-10-CM

## 2022-07-02 ENCOUNTER — Ambulatory Visit
Admission: RE | Admit: 2022-07-02 | Discharge: 2022-07-02 | Disposition: A | Discharge status: Home / Self Care | Payer: BC Managed Care – PPO | Source: Ambulatory Visit | Attending: Family Medicine | Admitting: Family Medicine

## 2022-07-02 DIAGNOSIS — Z1231 Encounter for screening mammogram for malignant neoplasm of breast: Secondary | ICD-10-CM

## 2022-11-15 LAB — HM PAP SMEAR: HPV, high-risk: NEGATIVE

## 2022-12-27 ENCOUNTER — Encounter: Payer: Self-pay | Admitting: Family Medicine

## 2022-12-27 ENCOUNTER — Ambulatory Visit (INDEPENDENT_AMBULATORY_CARE_PROVIDER_SITE_OTHER): Payer: BC Managed Care – PPO | Admitting: Family Medicine

## 2022-12-27 VITALS — BP 128/78 | HR 79 | Temp 97.9°F | Resp 17 | Ht 60.0 in | Wt 187.1 lb

## 2022-12-27 DIAGNOSIS — E559 Vitamin D deficiency, unspecified: Secondary | ICD-10-CM

## 2022-12-27 DIAGNOSIS — E669 Obesity, unspecified: Secondary | ICD-10-CM

## 2022-12-27 DIAGNOSIS — Z0000 Encounter for general adult medical examination without abnormal findings: Secondary | ICD-10-CM | POA: Diagnosis not present

## 2022-12-27 LAB — LIPID PANEL
Cholesterol: 174 mg/dL (ref 0–200)
HDL: 73.1 mg/dL (ref >39.00)
LDL Cholesterol: 92 mg/dL (ref 0–99)
NonHDL: 100.79
Total CHOL/HDL Ratio: 2
Triglycerides: 43 mg/dL (ref 0.0–149.0)
VLDL: 8.6 mg/dL (ref 0.0–40.0)

## 2022-12-27 LAB — BASIC METABOLIC PANEL
BUN: 9 mg/dL (ref 6–23)
CO2: 26 mEq/L (ref 19–32)
Calcium: 8.9 mg/dL (ref 8.4–10.5)
Chloride: 104 mEq/L (ref 96–112)
Creatinine, Ser: 0.85 mg/dL (ref 0.40–1.20)
GFR: 78.03 mL/min (ref >60.00)
Glucose, Bld: 83 mg/dL (ref 70–99)
Potassium: 4 mEq/L (ref 3.5–5.1)
Sodium: 139 mEq/L (ref 135–145)

## 2022-12-27 LAB — CBC WITH DIFFERENTIAL/PLATELET
Basophils Absolute: 0.1 10*3/uL (ref 0.0–0.1)
Basophils Relative: 1 % (ref 0.0–3.0)
Eosinophils Absolute: 0.4 10*3/uL (ref 0.0–0.7)
Eosinophils Relative: 6.2 % — ABNORMAL HIGH (ref 0.0–5.0)
HCT: 35.4 % — ABNORMAL LOW (ref 36.0–46.0)
Hemoglobin: 11.6 g/dL — ABNORMAL LOW (ref 12.0–15.0)
Lymphocytes Relative: 22.6 % (ref 12.0–46.0)
Lymphs Abs: 1.3 10*3/uL (ref 0.7–4.0)
MCHC: 32.9 g/dL (ref 30.0–36.0)
MCV: 87.7 fl (ref 78.0–100.0)
Monocytes Absolute: 0.4 10*3/uL (ref 0.1–1.0)
Monocytes Relative: 7.2 % (ref 3.0–12.0)
Neutro Abs: 3.6 10*3/uL (ref 1.4–7.7)
Neutrophils Relative %: 63 % (ref 43.0–77.0)
Platelets: 344 10*3/uL (ref 150.0–400.0)
RBC: 4.04 Mil/uL (ref 3.87–5.11)
RDW: 15.4 % (ref 11.5–15.5)
WBC: 5.7 10*3/uL (ref 4.0–10.5)

## 2022-12-27 LAB — HEPATIC FUNCTION PANEL
ALT: 13 U/L (ref 0–35)
AST: 21 U/L (ref 0–37)
Albumin: 4 g/dL (ref 3.5–5.2)
Alkaline Phosphatase: 52 U/L (ref 39–117)
Bilirubin, Direct: 0.1 mg/dL (ref 0.0–0.3)
Total Bilirubin: 0.4 mg/dL (ref 0.2–1.2)
Total Protein: 6.7 g/dL (ref 6.0–8.3)

## 2022-12-27 LAB — TSH: TSH: 1.16 u[IU]/mL (ref 0.35–5.50)

## 2022-12-27 LAB — VITAMIN D 25 HYDROXY (VIT D DEFICIENCY, FRACTURES): VITD: 48.2 ng/mL (ref 30.00–100.00)

## 2022-12-27 NOTE — Assessment & Plan Note (Signed)
Check labs and replete prn. 

## 2022-12-27 NOTE — Assessment & Plan Note (Signed)
Pt has gained 8 lbs since last visit.  BMI now 36.55  Encouraged healthy diet and regular exercise.  Check labs to risk stratify.  Will follow.

## 2022-12-27 NOTE — Assessment & Plan Note (Signed)
Pt's PE WNL w/ exception of BMI.  UTD on pap, mammo, colonoscopy, Tdap.  Check labs.  Anticipatory guidance provided.  

## 2022-12-27 NOTE — Patient Instructions (Signed)
Follow up in 1 year or as needed We'll notify you of your lab results and make any changes if needed Continue to work on healthy diet and regular exercise- you can do it!! Call with any questions or concerns Stay Safe!  Stay Healthy! ENJOY GRADUATION!!!

## 2022-12-27 NOTE — Progress Notes (Signed)
   Subjective:    Patient ID: Kerry Lawson, female    DOB: November 12, 1970, 52 y.o.   MRN: [id number]  HPI CPE- UTD on pap, mammo, colonoscopy, tdap.    Patient Care Team    Relationship Specialty Notifications Start End  Sheliah Hatch, MD PCP - General   08/12/10   Ilda Mori, MD Consulting Physician Obstetrics and Gynecology  01/13/15     Health Maintenance  Topic Date Due   PAP SMEAR-Modifier  09/20/2022   Zoster Vaccines- Shingrix (1 of 2) 03/28/2023 (Originally 03/21/2019)   INFLUENZA VACCINE  03/31/2023   MAMMOGRAM  07/03/2023   DTaP/Tdap/Td (2 - Td or Tdap) 01/02/2024   COLONOSCOPY (Pts 45-17yrs Insurance coverage will need to be confirmed)  02/13/2030   Hepatitis C Screening  Completed   HIV Screening  Completed   HPV VACCINES  Aged Out   COVID-19 Vaccine  Discontinued      Review of Systems Patient reports no vision/ hearing changes, adenopathy,fever, persistant/recurrent hoarseness , swallowing issues, chest pain, palpitations, edema, persistant/recurrent cough, hemoptysis, dyspnea (rest/exertional/paroxysmal nocturnal), gastrointestinal bleeding (melena, rectal bleeding), abdominal pain, significant heartburn, bowel changes, GU symptoms (dysuria, hematuria, incontinence), Gyn symptoms (abnormal  bleeding, pain),  syncope, focal weakness, memory loss, numbness & tingling, skin/hair changes, abnormal bruising or bleeding, anxiety, or depression.   + nail splitting- hx of anemia + 8 lb weight gain    Objective:   Physical Exam General Appearance:    Alert, cooperative, no distress, appears stated age, obese  Head:    Normocephalic, without obvious abnormality, atraumatic  Eyes:    PERRL, conjunctiva/corneas clear, EOM's intact both eyes  Ears:    Normal TM's and external ear canals, both ears  Nose:   Nares normal, septum midline, mucosa normal, no drainage    or sinus tenderness  Throat:   Lips, mucosa, and tongue normal; teeth and gums normal  Neck:    Supple, symmetrical, trachea midline, no adenopathy;    Thyroid: no enlargement/tenderness/nodules  Back:     Symmetric, no curvature, ROM normal, no CVA tenderness  Lungs:     Clear to auscultation bilaterally, respirations unlabored  Chest Wall:    No tenderness or deformity   Heart:    Regular rate and rhythm, S1 and S2 normal, no murmur, rub   or gallop  Breast Exam:    Deferred to GYN  Abdomen:     Soft, non-tender, bowel sounds active all four quadrants,    no masses, no organomegaly  Genitalia:    Deferred to GYN  Rectal:    Extremities:   Extremities normal, atraumatic, no cyanosis or edema  Pulses:   2+ and symmetric all extremities  Skin:   Skin color, texture, turgor normal, no rashes or lesions  Lymph nodes:   Cervical, supraclavicular, and axillary nodes normal  Neurologic:   CNII-XII intact, normal strength, sensation and reflexes    throughout          Assessment & Plan:

## 2022-12-28 ENCOUNTER — Telehealth: Payer: Self-pay

## 2022-12-28 NOTE — Telephone Encounter (Signed)
-----   Message from Sheliah Hatch, MD sent at 12/28/2022  7:19 AM EDT ----- Labs look great!  No changes at this time

## 2022-12-28 NOTE — Telephone Encounter (Signed)
Pt aware of lab results 

## 2023-06-06 ENCOUNTER — Other Ambulatory Visit: Payer: Self-pay | Admitting: Family Medicine

## 2023-06-06 DIAGNOSIS — Z1231 Encounter for screening mammogram for malignant neoplasm of breast: Secondary | ICD-10-CM

## 2023-07-04 ENCOUNTER — Ambulatory Visit
Admission: RE | Admit: 2023-07-04 | Discharge: 2023-07-04 | Disposition: A | Discharge status: Home / Self Care | Payer: BC Managed Care – PPO | Source: Ambulatory Visit

## 2023-07-04 DIAGNOSIS — Z1231 Encounter for screening mammogram for malignant neoplasm of breast: Secondary | ICD-10-CM

## 2023-12-08 ENCOUNTER — Other Ambulatory Visit: Payer: Self-pay | Admitting: Obstetrics and Gynecology

## 2023-12-08 DIAGNOSIS — R92333 Mammographic heterogeneous density, bilateral breasts: Secondary | ICD-10-CM

## 2023-12-28 ENCOUNTER — Ambulatory Visit (INDEPENDENT_AMBULATORY_CARE_PROVIDER_SITE_OTHER): Payer: BC Managed Care – PPO | Admitting: Family Medicine

## 2023-12-28 ENCOUNTER — Encounter: Payer: Self-pay | Admitting: Family Medicine

## 2023-12-28 VITALS — BP 118/80 | HR 68 | Temp 98.4°F | Ht 60.63 in | Wt 181.0 lb

## 2023-12-28 DIAGNOSIS — E669 Obesity, unspecified: Secondary | ICD-10-CM

## 2023-12-28 DIAGNOSIS — E559 Vitamin D deficiency, unspecified: Secondary | ICD-10-CM

## 2023-12-28 DIAGNOSIS — Z23 Encounter for immunization: Secondary | ICD-10-CM

## 2023-12-28 DIAGNOSIS — L603 Nail dystrophy: Secondary | ICD-10-CM

## 2023-12-28 DIAGNOSIS — Z0000 Encounter for general adult medical examination without abnormal findings: Secondary | ICD-10-CM | POA: Diagnosis not present

## 2023-12-28 LAB — CBC WITH DIFFERENTIAL/PLATELET
Basophils Absolute: 0.1 10*3/uL (ref 0.0–0.1)
Basophils Relative: 1.1 % (ref 0.0–3.0)
Eosinophils Absolute: 0.2 10*3/uL (ref 0.0–0.7)
Eosinophils Relative: 3.6 % (ref 0.0–5.0)
HCT: 32.7 % — ABNORMAL LOW (ref 36.0–46.0)
Hemoglobin: 10.7 g/dL — ABNORMAL LOW (ref 12.0–15.0)
Lymphocytes Relative: 25.7 % (ref 12.0–46.0)
Lymphs Abs: 1.4 10*3/uL (ref 0.7–4.0)
MCHC: 32.8 g/dL (ref 30.0–36.0)
MCV: 84.1 fl (ref 78.0–100.0)
Monocytes Absolute: 0.4 10*3/uL (ref 0.1–1.0)
Monocytes Relative: 6.6 % (ref 3.0–12.0)
Neutro Abs: 3.4 10*3/uL (ref 1.4–7.7)
Neutrophils Relative %: 63 % (ref 43.0–77.0)
Platelets: 353 10*3/uL (ref 150.0–400.0)
RBC: 3.88 Mil/uL (ref 3.87–5.11)
RDW: 16.8 % — ABNORMAL HIGH (ref 11.5–15.5)
WBC: 5.3 10*3/uL (ref 4.0–10.5)

## 2023-12-28 LAB — LIPID PANEL
Cholesterol: 167 mg/dL (ref 0–200)
HDL: 64.7 mg/dL (ref >39.00)
LDL Cholesterol: 93 mg/dL (ref 0–99)
NonHDL: 102.12
Total CHOL/HDL Ratio: 3
Triglycerides: 45 mg/dL (ref 0.0–149.0)
VLDL: 9 mg/dL (ref 0.0–40.0)

## 2023-12-28 LAB — HEPATIC FUNCTION PANEL
ALT: 13 U/L (ref 0–35)
AST: 21 U/L (ref 0–37)
Albumin: 3.9 g/dL (ref 3.5–5.2)
Alkaline Phosphatase: 57 U/L (ref 39–117)
Bilirubin, Direct: 0.1 mg/dL (ref 0.0–0.3)
Total Bilirubin: 0.3 mg/dL (ref 0.2–1.2)
Total Protein: 6.8 g/dL (ref 6.0–8.3)

## 2023-12-28 LAB — BASIC METABOLIC PANEL WITH GFR
BUN: 10 mg/dL (ref 6–23)
CO2: 28 meq/L (ref 19–32)
Calcium: 8.7 mg/dL (ref 8.4–10.5)
Chloride: 102 meq/L (ref 96–112)
Creatinine, Ser: 0.85 mg/dL (ref 0.40–1.20)
GFR: 77.48 mL/min (ref >60.00)
Glucose, Bld: 89 mg/dL (ref 70–99)
Potassium: 4.2 meq/L (ref 3.5–5.1)
Sodium: 135 meq/L (ref 135–145)

## 2023-12-28 LAB — TSH: TSH: 0.97 u[IU]/mL (ref 0.35–5.50)

## 2023-12-28 LAB — VITAMIN D 25 HYDROXY (VIT D DEFICIENCY, FRACTURES): VITD: 40.32 ng/mL (ref 30.00–100.00)

## 2023-12-28 NOTE — Addendum Note (Signed)
 Addended by: Barb Levers on: 12/28/2023 08:55 AM   Modules accepted: Orders

## 2023-12-28 NOTE — Progress Notes (Signed)
   Subjective:    Patient ID: Kerry Lawson, female    DOB: 09-20-1970, 53 y.o.   MRN: [id number]  HPI CPE- UTD on mammo, pap, colonoscopy.  Tdap is coming due  Patient Care Team    Relationship Specialty Notifications Start End  Jess Morita, MD PCP - General   08/12/10   Astrid Blamer, MD Consulting Physician Obstetrics and Gynecology  01/13/15     Health Maintenance  Topic Date Due   Zoster Vaccines- Shingrix (1 of 2) Never done   DTaP/Tdap/Td (2 - Td or Tdap) 01/02/2024   INFLUENZA VACCINE  03/30/2024   MAMMOGRAM  07/03/2024   Cervical Cancer Screening (HPV/Pap Cotest)  11/15/2027   Colonoscopy  02/13/2030   Hepatitis C Screening  Completed   HIV Screening  Completed   HPV VACCINES  Aged Out   Meningococcal B Vaccine  Aged Out   COVID-19 Vaccine  Discontinued      Review of Systems Patient reports no vision/ hearing changes, adenopathy,fever, weight change,  persistant/recurrent hoarseness , swallowing issues, chest pain, palpitations, edema, persistant/recurrent cough, hemoptysis, dyspnea (rest/exertional/paroxysmal nocturnal), gastrointestinal bleeding (melena, rectal bleeding), abdominal pain, significant heartburn, bowel changes, GU symptoms (dysuria, hematuria, incontinence), Gyn symptoms (abnormal  bleeding, pain),  syncope, focal weakness, memory loss, numbness & tingling, skin/hair changes, abnormal bruising or bleeding, anxiety, or depression.   + split nails- will grow in already split on multiple fingers    Objective:   Physical Exam General Appearance:    Alert, cooperative, no distress, appears stated age  Head:    Normocephalic, without obvious abnormality, atraumatic  Eyes:    PERRL, conjunctiva/corneas clear, EOM's intact both eyes  Ears:    Normal TM's and external ear canals, both ears  Nose:   Nares normal, septum midline, mucosa normal, no drainage    or sinus tenderness  Throat:   Lips, mucosa, and tongue normal; teeth and gums normal   Neck:   Supple, symmetrical, trachea midline, no adenopathy;    Thyroid : no enlargement/tenderness/nodules  Back:     Symmetric, no curvature, ROM normal, no CVA tenderness  Lungs:     Clear to auscultation bilaterally, respirations unlabored  Chest Wall:    No tenderness or deformity   Heart:    Regular rate and rhythm, S1 and S2 normal, no murmur, rub   or gallop  Breast Exam:    Deferred to GYN  Abdomen:     Soft, non-tender, bowel sounds active all four quadrants,    no masses, no organomegaly  Genitalia:    Deferred to GYN  Rectal:    Extremities:   Extremities normal, atraumatic, no cyanosis or edema  Pulses:   2+ and symmetric all extremities  Skin:   Skin color, texture, turgor normal, no rashes or lesions  Lymph nodes:   Cervical, supraclavicular, and axillary nodes normal  Neurologic:   CNII-XII intact, normal strength, sensation and reflexes    throughout          Assessment & Plan:

## 2023-12-28 NOTE — Assessment & Plan Note (Addendum)
 Pt's PE WNL w/ exception of BMI.  UTD on pap, mammo, colonoscopy.  Tdap updated today.  Check labs.  Anticipatory guidance provided.

## 2023-12-28 NOTE — Patient Instructions (Addendum)
 Follow up in 1 year or as needed We'll notify you of your lab results and make any changes if needed Keep up the good work on healthy diet and regular exercise- you're doing great! We'll call you to schedule your Derm appt Call with any questions or concerns Stay Safe!  Stay Healthy! Happy Spring!!!

## 2023-12-29 ENCOUNTER — Encounter: Payer: Self-pay | Admitting: Family Medicine

## 2023-12-29 NOTE — Telephone Encounter (Signed)
 Lab results have been discussed.   Verbalized understanding? Yes  Are there any questions? No

## 2023-12-29 NOTE — Telephone Encounter (Signed)
-----   Message from Laymon Priest sent at 12/29/2023  7:31 AM EDT ----- Labs look great w/ exception of mildly low hemoglobin- which is consistent w/ your diagnosis of anemia.  Please make sure you are taking a daily iron supplement

## 2024-06-05 ENCOUNTER — Other Ambulatory Visit: Payer: Self-pay | Admitting: Family Medicine

## 2024-06-05 DIAGNOSIS — Z1231 Encounter for screening mammogram for malignant neoplasm of breast: Secondary | ICD-10-CM

## 2024-07-05 ENCOUNTER — Ambulatory Visit
Admission: RE | Admit: 2024-07-05 | Discharge: 2024-07-05 | Disposition: A | Discharge status: Home / Self Care | Source: Ambulatory Visit

## 2024-07-05 DIAGNOSIS — Z1231 Encounter for screening mammogram for malignant neoplasm of breast: Secondary | ICD-10-CM

## 2024-12-28 ENCOUNTER — Encounter: Admitting: Family Medicine
